# Patient Record
Sex: Male | Born: 1945
Health system: Southern US, Community
[De-identification: ages and names within clinical notes are randomized; demographics above are authoritative.]

## PROBLEM LIST (undated history)

## (undated) DIAGNOSIS — N433 Hydrocele, unspecified: Secondary | ICD-10-CM

## (undated) DIAGNOSIS — R339 Retention of urine, unspecified: Secondary | ICD-10-CM

## (undated) DIAGNOSIS — N503 Cyst of epididymis: Secondary | ICD-10-CM

## (undated) DIAGNOSIS — E559 Vitamin D deficiency, unspecified: Secondary | ICD-10-CM

## (undated) DIAGNOSIS — R310 Gross hematuria: Secondary | ICD-10-CM

## (undated) DIAGNOSIS — E8881 Metabolic syndrome: Secondary | ICD-10-CM

## (undated) DIAGNOSIS — E291 Testicular hypofunction: Secondary | ICD-10-CM

## (undated) DIAGNOSIS — G4700 Insomnia, unspecified: Secondary | ICD-10-CM

## (undated) DIAGNOSIS — G14 Postpolio syndrome: Secondary | ICD-10-CM

## (undated) DIAGNOSIS — G4733 Obstructive sleep apnea (adult) (pediatric): Secondary | ICD-10-CM

## (undated) DIAGNOSIS — F5104 Psychophysiologic insomnia: Secondary | ICD-10-CM

## (undated) DIAGNOSIS — N5082 Scrotal pain: Secondary | ICD-10-CM

## (undated) DIAGNOSIS — M545 Low back pain, unspecified: Secondary | ICD-10-CM

## (undated) DIAGNOSIS — R9431 Abnormal electrocardiogram [ECG] [EKG]: Secondary | ICD-10-CM

## (undated) DIAGNOSIS — E785 Hyperlipidemia, unspecified: Secondary | ICD-10-CM

## (undated) DIAGNOSIS — D649 Anemia, unspecified: Secondary | ICD-10-CM

## (undated) DIAGNOSIS — A809 Acute poliomyelitis, unspecified: Secondary | ICD-10-CM

## (undated) DIAGNOSIS — R739 Hyperglycemia, unspecified: Secondary | ICD-10-CM

## (undated) DIAGNOSIS — K759 Inflammatory liver disease, unspecified: Secondary | ICD-10-CM

## (undated) DIAGNOSIS — I10 Essential (primary) hypertension: Secondary | ICD-10-CM

## (undated) DIAGNOSIS — N400 Enlarged prostate without lower urinary tract symptoms: Secondary | ICD-10-CM

## (undated) DIAGNOSIS — R972 Elevated prostate specific antigen [PSA]: Secondary | ICD-10-CM

## (undated) DIAGNOSIS — N281 Cyst of kidney, acquired: Secondary | ICD-10-CM

## (undated) DIAGNOSIS — N529 Male erectile dysfunction, unspecified: Secondary | ICD-10-CM

## (undated) DIAGNOSIS — E663 Overweight: Secondary | ICD-10-CM

## (undated) HISTORY — DX: Cyst of epididymis: N50.3

## (undated) HISTORY — PX: OTHER SURGICAL HISTORY: SHX169

## (undated) HISTORY — DX: Hyperlipidemia, unspecified: E78.5

## (undated) HISTORY — DX: Overweight: E66.3

## (undated) HISTORY — DX: Low back pain: M54.5

## (undated) HISTORY — DX: Scrotal pain: N50.82

## (undated) HISTORY — DX: Low back pain, unspecified: M54.50

## (undated) HISTORY — DX: Metabolic syndrome: E88.810

## (undated) HISTORY — PX: INGUINAL HERNIA REPAIR: SHX194

## (undated) HISTORY — DX: Obstructive sleep apnea (adult) (pediatric): G47.33

## (undated) HISTORY — DX: Benign prostatic hyperplasia without lower urinary tract symptoms: N40.0

## (undated) HISTORY — DX: Hydrocele, unspecified: N43.3

## (undated) HISTORY — DX: Retention of urine, unspecified: R33.9

## (undated) HISTORY — DX: Male erectile dysfunction, unspecified: N52.9

## (undated) HISTORY — DX: Testicular hypofunction: E29.1

## (undated) HISTORY — PX: COLONOSCOPY: SHX174

## (undated) HISTORY — DX: Essential (primary) hypertension: I10

## (undated) HISTORY — DX: Insomnia, unspecified: G47.00

## (undated) HISTORY — DX: Metabolic syndrome: E88.81

## (undated) HISTORY — DX: Abnormal electrocardiogram (ECG) (EKG): R94.31

## (undated) HISTORY — DX: Psychophysiologic insomnia: F51.04

## (undated) HISTORY — DX: Gross hematuria: R31.0

## (undated) HISTORY — DX: Postpolio syndrome: G14

## (undated) HISTORY — PX: LUMBAR LAMINECTOMY: SHX95

## (undated) HISTORY — DX: Hyperglycemia, unspecified: R73.9

## (undated) HISTORY — DX: Vitamin D deficiency, unspecified: E55.9

## (undated) HISTORY — PX: LEG SURGERY: SHX1003

## (undated) HISTORY — DX: Cyst of kidney, acquired: N28.1

## (undated) HISTORY — DX: Elevated prostate specific antigen (PSA): R97.20

---

## 2004-01-04 ENCOUNTER — Ambulatory Visit: Payer: Self-pay

## 2004-04-06 ENCOUNTER — Ambulatory Visit: Payer: Self-pay

## 2006-01-21 LAB — HM COLONOSCOPY: HM Colonoscopy: NORMAL

## 2006-07-23 ENCOUNTER — Ambulatory Visit: Payer: Self-pay | Admitting: Gastroenterology

## 2007-01-22 HISTORY — PX: TENDON GRAFT: SHX2486

## 2007-03-16 ENCOUNTER — Other Ambulatory Visit: Payer: Self-pay

## 2007-03-16 ENCOUNTER — Ambulatory Visit: Payer: Self-pay | Admitting: Specialist

## 2007-03-17 ENCOUNTER — Ambulatory Visit: Payer: Self-pay | Admitting: Cardiology

## 2007-03-17 ENCOUNTER — Ambulatory Visit: Payer: Self-pay | Admitting: Specialist

## 2007-03-26 ENCOUNTER — Encounter: Payer: Self-pay | Admitting: Specialist

## 2007-04-22 ENCOUNTER — Encounter: Payer: Self-pay | Admitting: Specialist

## 2007-05-22 ENCOUNTER — Encounter: Payer: Self-pay | Admitting: Specialist

## 2007-06-22 ENCOUNTER — Encounter: Payer: Self-pay | Admitting: Specialist

## 2007-07-07 ENCOUNTER — Encounter: Payer: Self-pay | Admitting: Orthopedic Surgery

## 2008-07-04 ENCOUNTER — Encounter: Payer: Self-pay | Admitting: Family Medicine

## 2008-07-21 ENCOUNTER — Encounter: Payer: Self-pay | Admitting: Family Medicine

## 2008-11-10 DIAGNOSIS — E559 Vitamin D deficiency, unspecified: Secondary | ICD-10-CM

## 2009-05-21 HISTORY — PX: ANKLE FUSION: SHX881

## 2012-03-31 ENCOUNTER — Ambulatory Visit: Payer: Self-pay | Admitting: Urology

## 2012-03-31 DIAGNOSIS — N433 Hydrocele, unspecified: Secondary | ICD-10-CM | POA: Diagnosis not present

## 2012-03-31 DIAGNOSIS — L723 Sebaceous cyst: Secondary | ICD-10-CM | POA: Diagnosis not present

## 2012-04-01 DIAGNOSIS — I10 Essential (primary) hypertension: Secondary | ICD-10-CM | POA: Diagnosis not present

## 2012-04-01 DIAGNOSIS — G4733 Obstructive sleep apnea (adult) (pediatric): Secondary | ICD-10-CM | POA: Diagnosis not present

## 2012-06-22 DIAGNOSIS — Z23 Encounter for immunization: Secondary | ICD-10-CM | POA: Diagnosis not present

## 2012-06-22 DIAGNOSIS — Z0000 Encounter for general adult medical examination without abnormal findings: Secondary | ICD-10-CM | POA: Diagnosis not present

## 2012-06-22 DIAGNOSIS — Z1211 Encounter for screening for malignant neoplasm of colon: Secondary | ICD-10-CM | POA: Diagnosis not present

## 2012-06-22 DIAGNOSIS — I10 Essential (primary) hypertension: Secondary | ICD-10-CM | POA: Diagnosis not present

## 2012-07-08 DIAGNOSIS — L821 Other seborrheic keratosis: Secondary | ICD-10-CM | POA: Diagnosis not present

## 2012-07-08 DIAGNOSIS — L820 Inflamed seborrheic keratosis: Secondary | ICD-10-CM | POA: Diagnosis not present

## 2012-08-20 DIAGNOSIS — N400 Enlarged prostate without lower urinary tract symptoms: Secondary | ICD-10-CM | POA: Diagnosis not present

## 2012-08-20 DIAGNOSIS — R972 Elevated prostate specific antigen [PSA]: Secondary | ICD-10-CM | POA: Diagnosis not present

## 2012-08-20 DIAGNOSIS — N433 Hydrocele, unspecified: Secondary | ICD-10-CM | POA: Diagnosis not present

## 2012-09-22 DIAGNOSIS — B91 Sequelae of poliomyelitis: Secondary | ICD-10-CM | POA: Diagnosis not present

## 2012-09-22 DIAGNOSIS — G4733 Obstructive sleep apnea (adult) (pediatric): Secondary | ICD-10-CM | POA: Diagnosis not present

## 2012-09-22 DIAGNOSIS — I10 Essential (primary) hypertension: Secondary | ICD-10-CM | POA: Diagnosis not present

## 2012-09-22 DIAGNOSIS — Z23 Encounter for immunization: Secondary | ICD-10-CM | POA: Diagnosis not present

## 2012-09-22 DIAGNOSIS — Z1331 Encounter for screening for depression: Secondary | ICD-10-CM | POA: Diagnosis not present

## 2012-09-23 DIAGNOSIS — I10 Essential (primary) hypertension: Secondary | ICD-10-CM | POA: Diagnosis not present

## 2013-02-18 DIAGNOSIS — R972 Elevated prostate specific antigen [PSA]: Secondary | ICD-10-CM | POA: Diagnosis not present

## 2013-02-18 DIAGNOSIS — N529 Male erectile dysfunction, unspecified: Secondary | ICD-10-CM | POA: Diagnosis not present

## 2013-02-18 DIAGNOSIS — N433 Hydrocele, unspecified: Secondary | ICD-10-CM | POA: Diagnosis not present

## 2013-03-22 DIAGNOSIS — N4340 Spermatocele of epididymis, unspecified: Secondary | ICD-10-CM | POA: Diagnosis not present

## 2013-03-22 DIAGNOSIS — E8881 Metabolic syndrome: Secondary | ICD-10-CM | POA: Diagnosis not present

## 2013-03-22 DIAGNOSIS — G4733 Obstructive sleep apnea (adult) (pediatric): Secondary | ICD-10-CM | POA: Diagnosis not present

## 2013-03-22 DIAGNOSIS — I10 Essential (primary) hypertension: Secondary | ICD-10-CM | POA: Diagnosis not present

## 2013-04-11 DIAGNOSIS — N433 Hydrocele, unspecified: Secondary | ICD-10-CM | POA: Diagnosis not present

## 2013-04-11 DIAGNOSIS — R319 Hematuria, unspecified: Secondary | ICD-10-CM | POA: Diagnosis not present

## 2013-04-13 DIAGNOSIS — R310 Gross hematuria: Secondary | ICD-10-CM | POA: Diagnosis not present

## 2013-04-28 ENCOUNTER — Ambulatory Visit: Payer: Self-pay | Admitting: Urology

## 2013-04-28 DIAGNOSIS — M6250 Muscle wasting and atrophy, not elsewhere classified, unspecified site: Secondary | ICD-10-CM | POA: Diagnosis not present

## 2013-04-28 DIAGNOSIS — R310 Gross hematuria: Secondary | ICD-10-CM | POA: Diagnosis not present

## 2013-04-28 DIAGNOSIS — N400 Enlarged prostate without lower urinary tract symptoms: Secondary | ICD-10-CM | POA: Diagnosis not present

## 2013-04-28 DIAGNOSIS — N281 Cyst of kidney, acquired: Secondary | ICD-10-CM | POA: Diagnosis not present

## 2013-04-29 DIAGNOSIS — R310 Gross hematuria: Secondary | ICD-10-CM | POA: Diagnosis not present

## 2013-05-06 DIAGNOSIS — R319 Hematuria, unspecified: Secondary | ICD-10-CM | POA: Diagnosis not present

## 2013-08-27 DIAGNOSIS — N400 Enlarged prostate without lower urinary tract symptoms: Secondary | ICD-10-CM | POA: Diagnosis not present

## 2013-08-27 DIAGNOSIS — R972 Elevated prostate specific antigen [PSA]: Secondary | ICD-10-CM | POA: Diagnosis not present

## 2013-09-28 DIAGNOSIS — Z23 Encounter for immunization: Secondary | ICD-10-CM | POA: Diagnosis not present

## 2013-09-28 DIAGNOSIS — Z0000 Encounter for general adult medical examination without abnormal findings: Secondary | ICD-10-CM | POA: Diagnosis not present

## 2013-09-28 DIAGNOSIS — Z9181 History of falling: Secondary | ICD-10-CM | POA: Diagnosis not present

## 2013-09-28 DIAGNOSIS — Z1331 Encounter for screening for depression: Secondary | ICD-10-CM | POA: Diagnosis not present

## 2014-02-25 DIAGNOSIS — N400 Enlarged prostate without lower urinary tract symptoms: Secondary | ICD-10-CM | POA: Diagnosis not present

## 2014-02-25 DIAGNOSIS — N503 Cyst of epididymis: Secondary | ICD-10-CM | POA: Diagnosis not present

## 2014-02-25 DIAGNOSIS — E663 Overweight: Secondary | ICD-10-CM | POA: Diagnosis not present

## 2014-02-25 DIAGNOSIS — R972 Elevated prostate specific antigen [PSA]: Secondary | ICD-10-CM | POA: Diagnosis not present

## 2014-02-25 DIAGNOSIS — R339 Retention of urine, unspecified: Secondary | ICD-10-CM | POA: Diagnosis not present

## 2014-02-25 DIAGNOSIS — F5221 Male erectile disorder: Secondary | ICD-10-CM | POA: Diagnosis not present

## 2014-02-25 LAB — HM PAP SMEAR: HM PAP: NORMAL

## 2014-02-25 LAB — PSA: PSA: NORMAL

## 2014-03-29 DIAGNOSIS — G4733 Obstructive sleep apnea (adult) (pediatric): Secondary | ICD-10-CM | POA: Diagnosis not present

## 2014-03-29 DIAGNOSIS — F5104 Psychophysiologic insomnia: Secondary | ICD-10-CM | POA: Diagnosis not present

## 2014-03-29 DIAGNOSIS — Z13220 Encounter for screening for lipoid disorders: Secondary | ICD-10-CM | POA: Diagnosis not present

## 2014-03-29 DIAGNOSIS — R739 Hyperglycemia, unspecified: Secondary | ICD-10-CM | POA: Diagnosis not present

## 2014-03-29 DIAGNOSIS — I10 Essential (primary) hypertension: Secondary | ICD-10-CM | POA: Diagnosis not present

## 2014-03-29 DIAGNOSIS — E8881 Metabolic syndrome: Secondary | ICD-10-CM | POA: Diagnosis not present

## 2014-03-29 DIAGNOSIS — E559 Vitamin D deficiency, unspecified: Secondary | ICD-10-CM | POA: Diagnosis not present

## 2014-03-29 LAB — LIPID PANEL
Cholesterol: 168 mg/dL (ref 0–200)
HDL: 49 mg/dL (ref 35–70)
LDL Cholesterol: 98 mg/dL
Triglycerides: 106 mg/dL (ref 40–160)

## 2014-03-29 LAB — HEMOGLOBIN A1C: Hgb A1c MFr Bld: 5.8 % (ref 4.0–6.0)

## 2014-04-22 ENCOUNTER — Ambulatory Visit
Admit: 2014-04-22 | Disposition: A | Discharge status: Home / Self Care | Payer: Self-pay | Attending: Urology | Admitting: Urology

## 2014-04-22 DIAGNOSIS — N433 Hydrocele, unspecified: Secondary | ICD-10-CM | POA: Diagnosis not present

## 2014-07-20 ENCOUNTER — Other Ambulatory Visit: Payer: Self-pay | Admitting: Family Medicine

## 2014-07-20 NOTE — Telephone Encounter (Signed)
Patient requesting refill. 

## 2014-07-21 ENCOUNTER — Other Ambulatory Visit: Payer: Self-pay | Admitting: Family Medicine

## 2014-07-21 NOTE — Telephone Encounter (Signed)
Patient requesting refill. 

## 2014-07-21 NOTE — Telephone Encounter (Signed)
Patient needs refill on Atorvistatine to be [facility], Patient has been trying to get this filled for a few days.

## 2014-07-21 NOTE — Telephone Encounter (Signed)
What dose? I can't open [employer] at home

## 2014-07-22 MED ORDER — ATORVASTATIN CALCIUM 40 MG PO TABS: 40.0000 mg | ORAL_TABLET | Freq: Every day | ORAL | Status: DC

## 2014-07-22 NOTE — Telephone Encounter (Signed)
Sorry it is the Atorvastatin 40 mg 1 tablet daily

## 2014-07-28 ENCOUNTER — Encounter: Payer: Self-pay | Admitting: Family Medicine

## 2014-07-28 DIAGNOSIS — G4733 Obstructive sleep apnea (adult) (pediatric): Secondary | ICD-10-CM | POA: Insufficient documentation

## 2014-07-28 DIAGNOSIS — R739 Hyperglycemia, unspecified: Secondary | ICD-10-CM | POA: Insufficient documentation

## 2014-07-28 DIAGNOSIS — N281 Cyst of kidney, acquired: Secondary | ICD-10-CM | POA: Insufficient documentation

## 2014-07-28 DIAGNOSIS — E785 Hyperlipidemia, unspecified: Secondary | ICD-10-CM | POA: Insufficient documentation

## 2014-07-28 DIAGNOSIS — E291 Testicular hypofunction: Secondary | ICD-10-CM | POA: Insufficient documentation

## 2014-07-28 DIAGNOSIS — N529 Male erectile dysfunction, unspecified: Secondary | ICD-10-CM | POA: Insufficient documentation

## 2014-07-28 DIAGNOSIS — G4700 Insomnia, unspecified: Secondary | ICD-10-CM | POA: Insufficient documentation

## 2014-07-28 DIAGNOSIS — N400 Enlarged prostate without lower urinary tract symptoms: Secondary | ICD-10-CM | POA: Insufficient documentation

## 2014-07-28 DIAGNOSIS — E669 Obesity, unspecified: Secondary | ICD-10-CM | POA: Insufficient documentation

## 2014-07-28 DIAGNOSIS — I10 Essential (primary) hypertension: Secondary | ICD-10-CM | POA: Insufficient documentation

## 2014-07-28 DIAGNOSIS — N4340 Spermatocele of epididymis, unspecified: Secondary | ICD-10-CM | POA: Insufficient documentation

## 2014-07-29 ENCOUNTER — Encounter (INDEPENDENT_AMBULATORY_CARE_PROVIDER_SITE_OTHER): Payer: Self-pay

## 2014-07-29 ENCOUNTER — Ambulatory Visit (INDEPENDENT_AMBULATORY_CARE_PROVIDER_SITE_OTHER): Payer: Medicare Other | Admitting: Family Medicine

## 2014-07-29 ENCOUNTER — Encounter: Payer: Self-pay | Admitting: Family Medicine

## 2014-07-29 VITALS — BP 126/68 | HR 84 | Temp 98.2°F | Resp 18 | Ht 69.0 in | Wt 267.9 lb

## 2014-07-29 DIAGNOSIS — H6123 Impacted cerumen, bilateral: Secondary | ICD-10-CM | POA: Diagnosis not present

## 2014-07-29 DIAGNOSIS — Z23 Encounter for immunization: Secondary | ICD-10-CM

## 2014-07-29 DIAGNOSIS — H9191 Unspecified hearing loss, right ear: Secondary | ICD-10-CM

## 2014-07-29 NOTE — Progress Notes (Signed)
Name: Shane Bowen   MRN: [id number]    DOB: Sep 29, 1945   Date:07/29/2014       Progress Note  Subjective  Chief Complaint  Chief Complaint  Patient presents with  . Ear Fullness    slight hearing loss right ear(possible ear wax)    HPI  Cerumen Impaction: patient woke up a week ago with sudden hearing loss right ear, he tried ear lavage at home but it has not improved. No fever, no dizziness, no vertigo, no URI.    Patient Active Problem List   Diagnosis Date Noted  . Kidney cysts 07/28/2014  . BPH (benign prostatic hyperplasia) 07/28/2014  . Insomnia, persistent 07/28/2014  . Dyslipidemia 07/28/2014  . Impotence of organic origin 07/28/2014  . Essential (primary) hypertension 07/28/2014  . Blood glucose elevated 07/28/2014  . Male hypogonadism 07/28/2014  . Obesity (BMI 30-39.9) 07/28/2014  . Obstructive apnea 07/28/2014  . Spermatocele 07/28/2014  . Vitamin D deficiency 11/10/2008    History  Substance Use Topics  . Smoking status: Former Smoker -- 25 years    Types: Cigarettes    Quit date: 01/21/1993  . Smokeless tobacco: Never Used  . Alcohol Use: 0.0 oz/week    0 Standard drinks or equivalent per week     Comment: occasionally     Current outpatient prescriptions:  .  amLODipine-benazepril (LOTREL) 10-20 MG per capsule, Take 1 capsule by mouth daily., Disp: , Rfl:  .  aspirin 81 MG tablet, Take 1 tablet by mouth daily., Disp: , Rfl:  .  atorvastatin (LIPITOR) 40 MG tablet, Take 1 tablet (40 mg total) by mouth daily., Disp: 30 tablet, Rfl: 5 .  bisoprolol-hydrochlorothiazide (ZIAC) 2.5-6.25 MG per tablet, Take 1 tablet by mouth every morning., Disp: , Rfl:  .  tadalafil (CIALIS) 20 MG tablet, Take 1 tablet by mouth as needed., Disp: , Rfl:   Allergies  Allergen Reactions  . Duloxetine Hcl     ROS  Ten systems reviewed and is negative except as mentioned in HPI Losing weight, but life style modification  Objective  Filed Vitals:   07/29/14  0820  BP: 126/68  Pulse: 84  Temp: 98.2 F (36.8 C)  TempSrc: Oral  Resp: 18  Height: 5\' 9"  (1.753 m)  Weight: 267 lb 14.4 oz (121.519 kg)  SpO2: 94%    Body mass index is 39.54 kg/(m^2).    Physical Exam  Constitutional: Patient appears well-developed and well-nourished. No distress. Obesity Eyes:  No scleral icterus.  Neck: Normal range of motion. Neck supple. Cardiovascular: Normal rate, regular rhythm and normal heart sounds.  No murmur heard. No BLE edema. Pulmonary/Chest: Effort normal and breath sounds normal. No respiratory distress. Psychiatric: Patient has a normal mood and affect. behavior is normal. Judgment and thought content normal. Ear exam: bilateral cerumen.  Right side completely occluded.     Assessment & Plan  1. Hearing loss, right  - Ear Lavage   2. Cerumen impaction, bilateral  - Ear Lavage: patient gave verbal consent. An elephant tube was used, warm water and peroxide. Patient tolerated procedure well.   3. Need for pneumococcal vaccination

## 2014-08-02 ENCOUNTER — Encounter: Payer: Self-pay | Admitting: Family Medicine

## 2014-08-02 LAB — HM PAP SMEAR

## 2014-08-25 ENCOUNTER — Ambulatory Visit: Payer: Self-pay | Admitting: Urology

## 2014-09-30 ENCOUNTER — Ambulatory Visit (INDEPENDENT_AMBULATORY_CARE_PROVIDER_SITE_OTHER): Payer: Medicare Other | Admitting: Family Medicine

## 2014-09-30 ENCOUNTER — Encounter: Payer: Self-pay | Admitting: Family Medicine

## 2014-09-30 VITALS — BP 116/64 | HR 87 | Temp 97.9°F | Resp 18 | Ht 69.0 in | Wt 273.2 lb

## 2014-09-30 DIAGNOSIS — R2681 Unsteadiness on feet: Secondary | ICD-10-CM

## 2014-09-30 DIAGNOSIS — Z0000 Encounter for general adult medical examination without abnormal findings: Secondary | ICD-10-CM

## 2014-09-30 DIAGNOSIS — R739 Hyperglycemia, unspecified: Secondary | ICD-10-CM | POA: Diagnosis not present

## 2014-09-30 DIAGNOSIS — K429 Umbilical hernia without obstruction or gangrene: Secondary | ICD-10-CM

## 2014-09-30 DIAGNOSIS — Z8612 Personal history of poliomyelitis: Secondary | ICD-10-CM | POA: Diagnosis not present

## 2014-09-30 DIAGNOSIS — Z23 Encounter for immunization: Secondary | ICD-10-CM

## 2014-09-30 DIAGNOSIS — G14 Postpolio syndrome: Secondary | ICD-10-CM | POA: Diagnosis not present

## 2014-09-30 DIAGNOSIS — I10 Essential (primary) hypertension: Secondary | ICD-10-CM

## 2014-09-30 DIAGNOSIS — E785 Hyperlipidemia, unspecified: Secondary | ICD-10-CM | POA: Diagnosis not present

## 2014-09-30 DIAGNOSIS — E559 Vitamin D deficiency, unspecified: Secondary | ICD-10-CM | POA: Diagnosis not present

## 2014-09-30 DIAGNOSIS — G4733 Obstructive sleep apnea (adult) (pediatric): Secondary | ICD-10-CM

## 2014-09-30 MED ORDER — AMLODIPINE BESY-BENAZEPRIL HCL 10-20 MG PO CAPS: 1.0000 | ORAL_CAPSULE | Freq: Every day | ORAL | Status: DC

## 2014-09-30 MED ORDER — ATORVASTATIN CALCIUM 40 MG PO TABS: 40.0000 mg | ORAL_TABLET | Freq: Every day | ORAL | Status: DC

## 2014-09-30 NOTE — Progress Notes (Signed)
Name: Shane Bowen   MRN: [id number]    DOB: [birth year]   Date:09/30/2014       Progress Note  Subjective  Chief Complaint  Chief Complaint  Patient presents with  . Annual Exam    HPI  Functional ability/safety issues: He has a history of Poliomyelitis , he usees a scooter to use prn when walks long distance  Hearing issues: Addressed   Activities of daily living: Discussed  Home safety issues: No Issues  End Of Life Planning: he has advanced directives, healthcare power of attorney.  Preventative care, Health maintenance, Preventative health measures discussed.  Preventative screenings discussed today: lab work, colonoscopy, PSA ( sees Insurance underwriter )   Men age 24 to 24 years if ever smoked recommended to get a one time AAA ultrasound screening exam. He wants to hold off on it at this time  Low Dose CT Chest recommended if Age 77-80 years, 30 pack-year currently smoking OR have quit w/in 15years.   Lifestyle risk factor issued reviewed: Diet, exercise, weight management, advised patient smoking is not healthy, nutrition/diet.  Preventative health measures discussed (5-10 year plan).  Reviewed and recommended vaccinations: - Pneumovax  - Prevnar  - Annual Influenza - Zostavax - Tdap   Depression screening: Done Fall risk screening: Done Discuss ADLs/IADLs: Done  Current medical providers: See HPI  Other health risk factors identified this visit: No other issues Cognitive impairment issues: None identified  All above discussed with patient. Appropriate education, counseling and referral will be made based upon the above.   OSA: he wears his CPAP every night, but ESS is still high at 15.  He is due for repeat sleep study  HTN: bp is towards low end of normal, we will stop Ziac today and recheck next visit, he was advised to monitor at home and call back if bp goes above 140/90  Dyslipidemia: lipid panel looked normal, but his ASCVD was 14% and he has been on  statin therapy, denies side effects  Patient Active Problem List   Diagnosis Date Noted  . Kidney cysts 07/28/2014  . BPH (benign prostatic hyperplasia) 07/28/2014  . Insomnia, persistent 07/28/2014  . Dyslipidemia 07/28/2014  . Impotence of organic origin 07/28/2014  . Essential (primary) hypertension 07/28/2014  . Blood glucose elevated 07/28/2014  . Male hypogonadism 07/28/2014  . Obesity (BMI 30-39.9) 07/28/2014  . Obstructive apnea 07/28/2014  . Spermatocele 07/28/2014  . Vitamin D deficiency 11/10/2008    Past Surgical History  Procedure Laterality Date  . Ankle fusion Right 05/2009  . Tendon graft Right 2009    Hand after Lacertion Injury   . Inguinal hernia repair    . Lumbar laminectomy      Family History  Problem Relation Age of Onset  . Hyperlipidemia Mother   . Heart disease Father   . Heart disease Sister     ATF    Social History   Social History  . Marital Status: Married    Spouse Name: N/A  . Number of Children: N/A  . Years of Education: N/A   Occupational History  . Not on file.   Social History Main Topics  . Smoking status: Former Smoker -- 25 years    Types: Cigarettes    Quit date: 01/21/1993  . Smokeless tobacco: Never Used  . Alcohol Use: 0.0 oz/week    0 Standard drinks or equivalent per week     Comment: occasionally  . Drug Use: No  . Sexual Activity: Not Currently  Other Topics Concern  . Not on file   Social History Narrative     Current outpatient prescriptions:  .  amLODipine-benazepril (LOTREL) 10-20 MG per capsule, Take 1 capsule by mouth daily., Disp: 30 capsule, Rfl: 5 .  aspirin 81 MG tablet, Take 1 tablet by mouth daily., Disp: , Rfl:  .  atorvastatin (LIPITOR) 40 MG tablet, Take 1 tablet (40 mg total) by mouth daily., Disp: 30 tablet, Rfl: 5 .  tadalafil (CIALIS) 20 MG tablet, Take 1 tablet by mouth as needed., Disp: , Rfl:   Allergies  Allergen Reactions  . Duloxetine Hcl      ROS  Constitutional:  Negative for fever and positive for weight change.  Respiratory: Negative for cough , positive for  shortness of breath with activity.   Cardiovascular: Negative for chest pain or palpitations.  Gastrointestinal: Negative for abdominal pain, no bowel changes.  Musculoskeletal: Positive  for gait problem- right leg is shorter and has severe atrophy from polio,  No  joint swelling.  Skin: Negative for rash.  Neurological: Negative for dizziness or headache.  No other specific complaints in a complete review of systems (except as listed in HPI above).  Objective  Filed Vitals:   09/30/14 0835  BP: 116/64  Pulse: 87  Temp: 97.9 F (36.6 C)  TempSrc: Oral  Resp: 18  Height: 5\' 9"  (1.753 m)  Weight: 273 lb 3.2 oz (123.923 kg)  SpO2: 97%    Body mass index is 40.33 kg/(m^2).  Physical Exam  Constitutional: Patient appears well-developed and obesity . No distress.  HENT: Head: Normocephalic and atraumatic. Ears: B TMs ok, no erythema or effusion; Nose: Nose normal. Mouth/Throat: Oropharynx is clear and moist. No oropharyngeal exudate.  Eyes: Conjunctivae and EOM are normal. Pupils are equal, round, and reactive to light. No scleral icterus.  Neck: Normal range of motion. Neck supple. No JVD present. No thyromegaly present.  Cardiovascular: Normal rate, regular rhythm and normal heart sounds.  No murmur heard. No BLE edema. Pulmonary/Chest: Effort normal and breath sounds normal. No respiratory distress. Abdominal: Soft. Bowel sounds are normal, no distension. There is no tenderness. no masses MALE GENITALIA: sees Urologist RECTAL: seeing Urologist Musculoskeletal: atrophy of right leg, and scaring from previous surgeries Neurological: he is alert and oriented to person, place, and time. No cranial nerve deficit. Weak right leg from polio, atrophy , normal sensation  Skin: Skin is warm and dry. No rash noted. No erythema.  Psychiatric: Patient has a normal mood and affect. behavior is  normal. Judgment and thought content normal.  Diabetic Foot Exam:   PHQ2/9: Depression screen [facility] 2/9 09/30/2014 07/29/2014  Decreased Interest 0 0  Down, Depressed, Hopeless 0 0  PHQ - 2 Score 0 0     Fall Risk: Fall Risk  09/30/2014 07/29/2014  Falls in the past year? Yes Yes  Number falls in past yr: 1 1  Injury with Fall? No No      Functional Status Survey: Is the patient deaf or have difficulty hearing?: No Does the patient have difficulty seeing, even when wearing glasses/contacts?: Yes (glasses) Does the patient have difficulty concentrating, remembering, or making decisions?: No Does the patient have difficulty walking or climbing stairs?: Yes (walks with a cane) Does the patient have difficulty dressing or bathing?: No Does the patient have difficulty doing errands alone such as visiting a doctor's office or shopping?: No   Assessment & Plan  1. Medicare annual wellness visit, subsequent Discussed importance of 150  minutes of physical activity weekly, eat two servings of fish weekly, eat one serving of tree nuts ( cashews, pistachios, pecans, almonds.Marland Kitchen) every other day, eat 6 servings of fruit/vegetables daily and drink plenty of water and avoid sweet beverages.  To improve HDL patient  needs to eat tree nuts ( pecans/pistachios/almonds ) four times weekly, eat fish two times weekly  and exercise  at least 150 minutes per week  2. Needs flu shot  - Flu vaccine HIGH DOSE PF (Fluzone High dose)  3. Dyslipidemia  - atorvastatin (LIPITOR) 40 MG tablet; Take 1 tablet (40 mg total) by mouth daily.  Dispense: 30 tablet; Refill: 5 - Lipid panel  4. Vitamin D deficiency  - Vit D  25 hydroxy (rtn osteoporosis monitoring)  5. Obstructive apnea  - Nocturnal polysomnography (NPSG); Future  6. Essential (primary) hypertension Stop Ziac - amLODipine-benazepril (LOTREL) 10-20 MG per capsule; Take 1 capsule by mouth daily.  Dispense: 30 capsule; Refill: 5 - Comprehensive  metabolic panel  7. Blood glucose elevated  - Hemoglobin A1c

## 2014-09-30 NOTE — Patient Instructions (Signed)
  Mr. Shane Bowen , Thank you for taking time to come for your Medicare Wellness Visit. I appreciate your ongoing commitment to your health goals. Please review the following plan we discussed and let me know if I can assist you in the future.   These are the goals we discussed:  -eat 6 servings of fruit and vegetables daily -eat tree nuts - one serving - every other day -loose 5 lbs before next visit   This is a list of the screening recommended for you and due dates:  Health Maintenance  Topic Date Due  . Flu Shot  08/22/2015  . Colon Cancer Screening  01/22/2016  . Tetanus Vaccine  05/11/2017  . Shingles Vaccine  Completed  .  Hepatitis C: One time screening is recommended by Center for Disease Control  (CDC) for  adults born from [birth year] through 1963.   Completed  . Pneumonia vaccines  Completed

## 2014-10-01 LAB — LIPID PANEL
CHOL/HDL RATIO: 2.5 ratio (ref 0.0–5.0)
Cholesterol, Total: 130 mg/dL (ref 100–199)
HDL: 53 mg/dL (ref >39)
LDL Calculated: 54 mg/dL (ref 0–99)
Triglycerides: 116 mg/dL (ref 0–149)
VLDL Cholesterol Cal: 23 mg/dL (ref 5–40)

## 2014-10-01 LAB — COMPREHENSIVE METABOLIC PANEL
ALBUMIN: 4.4 g/dL (ref 3.6–4.8)
ALK PHOS: 97 IU/L (ref 39–117)
ALT: 39 IU/L (ref 0–44)
AST: 24 IU/L (ref 0–40)
Albumin/Globulin Ratio: 1.8 (ref 1.1–2.5)
BUN / CREAT RATIO: 16 (ref 10–22)
BUN: 15 mg/dL (ref 8–27)
Bilirubin Total: 0.5 mg/dL (ref 0.0–1.2)
CALCIUM: 10 mg/dL (ref 8.6–10.2)
CO2: 26 mmol/L (ref 18–29)
CREATININE: 0.94 mg/dL (ref 0.76–1.27)
Chloride: 96 mmol/L — ABNORMAL LOW (ref 97–108)
GFR calc non Af Amer: 84 mL/min/{1.73_m2} (ref >59)
GFR, EST AFRICAN AMERICAN: 97 mL/min/{1.73_m2} (ref >59)
GLOBULIN, TOTAL: 2.4 g/dL (ref 1.5–4.5)
Glucose: 100 mg/dL — ABNORMAL HIGH (ref 65–99)
Potassium: 4.6 mmol/L (ref 3.5–5.2)
SODIUM: 139 mmol/L (ref 134–144)
Total Protein: 6.8 g/dL (ref 6.0–8.5)

## 2014-10-01 LAB — HEMOGLOBIN A1C
Est. average glucose Bld gHb Est-mCnc: 123 mg/dL
HEMOGLOBIN A1C: 5.9 % — AB (ref 4.8–5.6)

## 2014-10-01 LAB — VITAMIN D 25 HYDROXY (VIT D DEFICIENCY, FRACTURES): VIT D 25 HYDROXY: 22.2 ng/mL — AB (ref 30.0–100.0)

## 2014-10-03 NOTE — Progress Notes (Signed)
Patient notified

## 2014-10-10 ENCOUNTER — Encounter: Payer: Self-pay | Admitting: *Deleted

## 2014-10-10 ENCOUNTER — Ambulatory Visit (INDEPENDENT_AMBULATORY_CARE_PROVIDER_SITE_OTHER): Payer: Medicare Other | Admitting: Family Medicine

## 2014-10-10 VITALS — BP 138/72 | HR 96 | Temp 98.2°F | Resp 18 | Wt 267.5 lb

## 2014-10-10 DIAGNOSIS — J40 Bronchitis, not specified as acute or chronic: Secondary | ICD-10-CM

## 2014-10-10 MED ORDER — AMOXICILLIN-POT CLAVULANATE 875-125 MG PO TABS: 1.0000 | ORAL_TABLET | Freq: Two times a day (BID) | ORAL | Status: DC

## 2014-10-10 MED ORDER — BENZONATATE 200 MG PO CAPS: 200.0000 mg | ORAL_CAPSULE | Freq: Three times a day (TID) | ORAL | Status: DC | PRN

## 2014-10-10 NOTE — Patient Instructions (Signed)

## 2014-10-10 NOTE — Progress Notes (Signed)
Name: Shane Bowen   MRN: [id number]    DOB: 1945/12/16   Date:10/10/2014       Progress Note  Subjective  Chief Complaint  Chief Complaint  Patient presents with  . URI    HPI  Patient is here today with concerns regarding the following symptoms sore throat, congestion, sneezing, productive cough, tightness in his chest and some clear nasal discharge that started about a week ago. Has tried the following home remedies: Day-Quil and Ny-Quil. Not associated with fever, rash, sick contacts, recent travel. Did have some loose BMs starting about day 4 of his illness. He rarely gets ill he states.    Past Medical History  Diagnosis Date  . Vitamin D deficiency   . Bilateral renal cysts   . BPH with elevated PSA   . Incomplete bladder emptying   . Hyperlipidemia   . Hypertension   . Obstructive sleep apnea   . Chronic insomnia   . Metabolic syndrome   . Lumbago   . Post-polio syndrome   . Insomnia   . Hyperglycemia   . Androgen deficiency   . Scrotal pain   . Erectile dysfunction   . Epididymal cyst   . Over weight   . Gross hematuria   . Hydrocele, right   . Abnormal ECG     Social History  Substance Use Topics  . Smoking status: Former Smoker -- 25 years    Types: Cigarettes    Quit date: 01/21/1993  . Smokeless tobacco: Never Used  . Alcohol Use: 0.0 oz/week    0 Standard drinks or equivalent per week     Comment: occasionally     Current outpatient prescriptions:  .  amLODipine-benazepril (LOTREL) 10-20 MG per capsule, Take 1 capsule by mouth daily., Disp: 30 capsule, Rfl: 5 .  aspirin 81 MG tablet, Take 1 tablet by mouth daily., Disp: , Rfl:  .  atorvastatin (LIPITOR) 40 MG tablet, Take 1 tablet (40 mg total) by mouth daily., Disp: 30 tablet, Rfl: 5 .  tadalafil (CIALIS) 20 MG tablet, Take 1 tablet by mouth as needed., Disp: , Rfl:   Allergies  Allergen Reactions  . Duloxetine Hcl     ROS  Positive for sore throat, congestion, sneezing,  productive cough, tightness in his chest and some clear nasal discharge, loose BMs as mentioned in HPI, otherwise all systems reviewed and are negative.  Objective  Filed Vitals:   10/10/14 1553  BP: 138/72  Pulse: 96  Temp: 98.2 F (36.8 C)  TempSrc: Oral  Resp: 18  Weight: 267 lb 8 oz (121.337 kg)  SpO2: 95%   Body mass index is 39.48 kg/(m^2).   Physical Exam  Constitutional: Patient is obese and well-nourished. In no acute distress but does appear to be fatigued from acute illness. HEENT:  - Head: Normocephalic and atraumatic.  - Ears: RIGHT TM bulging with minimal clear exudate, LEFT TM bulging with minimal clear exudate.  - Nose: Nasal mucosa boggy and congested.  - Mouth/Throat: Oropharynx is moist with slight erythema of bilateral tonsils without hypertrophy or exudates. Post nasal drainage present.  - Eyes: Conjunctivae clear, EOM movements normal. PERRLA. No scleral icterus.  Neck: Normal range of motion. Neck supple. No JVD present. No thyromegaly present. No local lymphadenopathy. Cardiovascular: Regular rate, regular rhythm with no murmurs heard.  Pulmonary/Chest: Effort normal and breath sounds clear in all lung fields with exception to upper anterior airway mild rhonchi. Musculoskeletal: Normal range of motion bilateral UE and LE,  no joint effusions. Skin: Skin is warm and dry. No rash noted. Psychiatric: Patient has a normal mood and affect. Behavior is normal in office today. Judgment and thought content normal in office today.   Assessment & Plan 1. Bronchitis Likely initial viral prodrome now superimposed with bacterial infection. Instructed patient on increasing hydration, nasal saline spray, steam inhalation, NSAID if tolerated and not contraindicated. Start augmentin daily and tessalon perls as needed. If symptoms persist/worsen may call me for CXR.   - amoxicillin-clavulanate (AUGMENTIN) 875-125 MG per tablet; Take 1 tablet by mouth 2 (two) times daily.   Dispense: 20 tablet; Refill: 0 - benzonatate (TESSALON) 200 MG capsule; Take 1 capsule (200 mg total) by mouth 3 (three) times daily as needed for cough.  Dispense: 20 capsule; Refill: 0

## 2014-10-17 ENCOUNTER — Ambulatory Visit (INDEPENDENT_AMBULATORY_CARE_PROVIDER_SITE_OTHER): Payer: Medicare Other | Admitting: Urology

## 2014-10-17 ENCOUNTER — Encounter: Payer: Self-pay | Admitting: Urology

## 2014-10-17 VITALS — BP 107/71 | HR 74 | Ht 69.0 in | Wt 265.7 lb

## 2014-10-17 DIAGNOSIS — N529 Male erectile dysfunction, unspecified: Secondary | ICD-10-CM | POA: Insufficient documentation

## 2014-10-17 DIAGNOSIS — N401 Enlarged prostate with lower urinary tract symptoms: Secondary | ICD-10-CM

## 2014-10-17 DIAGNOSIS — R972 Elevated prostate specific antigen [PSA]: Secondary | ICD-10-CM

## 2014-10-17 DIAGNOSIS — N400 Enlarged prostate without lower urinary tract symptoms: Secondary | ICD-10-CM | POA: Diagnosis not present

## 2014-10-17 DIAGNOSIS — Z87448 Personal history of other diseases of urinary system: Secondary | ICD-10-CM | POA: Diagnosis not present

## 2014-10-17 DIAGNOSIS — N528 Other male erectile dysfunction: Secondary | ICD-10-CM | POA: Diagnosis not present

## 2014-10-17 DIAGNOSIS — N138 Other obstructive and reflux uropathy: Secondary | ICD-10-CM

## 2014-10-17 LAB — MICROSCOPIC EXAMINATION: RBC, UA: NONE SEEN /hpf (ref 0–?)

## 2014-10-17 LAB — URINALYSIS, COMPLETE
Bilirubin, UA: NEGATIVE
Glucose, UA: NEGATIVE
KETONES UA: NEGATIVE
LEUKOCYTES UA: NEGATIVE
Nitrite, UA: NEGATIVE
PH UA: 5 (ref 5.0–7.5)
PROTEIN UA: NEGATIVE
SPEC GRAV UA: 1.02 (ref 1.005–1.030)
Urobilinogen, Ur: 0.2 mg/dL (ref 0.2–1.0)

## 2014-10-17 NOTE — Progress Notes (Signed)
10/17/2014 4:20 PM   Shane Bowen [birth year] [id number]  Referring provider: Alba Cory, MD [address] [place], [place] [address]  Chief Complaint  Patient presents with  . Benign Prostatic Hypertrophy    30month follow up    HPI: Patient is a 69 year old white male who has a history of erectile dysfunction, elevated PSA and BPH with LUTS who presents today for a 6 month follow up.    Erectile dysfunction His SHIM score is incomplete, which is not score able.   He has been having difficulty with erections for over five years.   His major complaint is not having willing partner.  His libido is preserved.   His risk factors for ED are age, BPH, HTN and hypercholesterolemia.  He denies any painful erections or curvatures with his erections.   He has tried Cialis in the past.  He states he has not attempted sexual intercourse in over 5 years.        SHIM      10/17/14 0950       SHIM: Over the last 6 months:   How do you rate your confidence that you could get and keep an erection? Very Low     When you had erections with sexual stimulation, how often were your erections hard enough for penetration (entering your partner)? No Sexual Activity  Pt states he have not had intercourse in 5 yrs      During sexual intercourse, how often were you able to maintain your erection after you had penetrated (entered) your partner? Did not attempt intercourse     During sexual intercourse, how difficult was it to maintain your erection to completion of intercourse? Did not attempt intercourse     When you attempted sexual intercourse, how often was it satisfactory for you? Did not attempt intercourse     SHIM Total Score   SHIM 1        Score: 1-7 Severe ED 8-11 Moderate ED 12-16 Mild-Moderate ED 17-21 Mild ED 22-25 No ED  Elevated PSA Patient had an elevated PSA of 4.5 ng/mL on 10/03/2011.  His PSA's have returned below 4 since that time.  His latest PSA was  3.8 ng/mL on 02/25/2014.  BPH WITH LUTS His IPSS score today is 14, which is moderate lower urinary tract symptomatology. He is mixed with his quality life due to his urinary symptoms.  His major complaint is urinary hesitancy and incomplete emptying.  He is not wanting to start medication at this time. His previous PVR is > 39 mL.  He denies any dysuria, hematuria or suprapubic pain.   He also denies any recent fevers, chills, nausea or vomiting.   He does not have a family history of PCa.      IPSS      10/17/14 0900       International Prostate Symptom Score   How often have you had the sensation of not emptying your bladder? Less than half the time     How often have you had to urinate less than every two hours? About half the time     How often have you found you stopped and started again several times when you urinated? More than half the time     How often have you found it difficult to postpone urination? Less than 1 in 5 times     How often have you had a weak urinary stream? Less than 1 in 5 times  How often have you had to strain to start urination? Less than half the time     How many times did you typically get up at night to urinate? 1 Time     Total IPSS Score 14     Quality of Life due to urinary symptoms   If you were to spend the rest of your life with your urinary condition just the way it is now how would you feel about that? Mixed        Score:  1-7 Mild 8-19 Moderate 20-35 Severe  History of gross hematuria Patient underwent workup for gross hematuria in 04/2013 with CT urogram and cystoscopically. He was found to have bilateral renal cysts, an enlarged prostate and hydrocele.  His UA today did not demonstrate any microscopic hematuria and he did not endorse any recent gross hematuria.   PMH: Past Medical History  Diagnosis Date  . Vitamin D deficiency   . Bilateral renal cysts   . BPH with elevated PSA   . Incomplete bladder emptying   .  Hyperlipidemia   . Hypertension   . Obstructive sleep apnea   . Chronic insomnia   . Metabolic syndrome   . Lumbago   . Post-polio syndrome   . Insomnia   . Hyperglycemia   . Androgen deficiency   . Scrotal pain   . Erectile dysfunction   . Epididymal cyst   . Over weight   . Gross hematuria   . Hydrocele, right   . Abnormal ECG     Surgical History: Past Surgical History  Procedure Laterality Date  . Ankle fusion Right 05/2009  . Tendon graft Right 2009    Hand after Lacertion Injury   . Inguinal hernia repair    . Lumbar laminectomy      Home Medications:    Medication List       This list is accurate as of: 10/17/14  4:20 PM.  Always use your most recent med list.               amLODipine-benazepril 10-20 MG per capsule  Commonly known as:  LOTREL  Take 1 capsule by mouth daily.     amoxicillin-clavulanate 875-125 MG per tablet  Commonly known as:  AUGMENTIN  Take 1 tablet by mouth 2 (two) times daily.     aspirin 81 MG tablet  Take 1 tablet by mouth daily.     atorvastatin 40 MG tablet  Commonly known as:  LIPITOR  Take 1 tablet (40 mg total) by mouth daily.     benzonatate 200 MG capsule  Commonly known as:  TESSALON  Take 1 capsule (200 mg total) by mouth 3 (three) times daily as needed for cough.     CIALIS 20 MG tablet  Generic drug:  tadalafil  Take 1 tablet by mouth as needed.        Allergies:  Allergies  Allergen Reactions  . Duloxetine Hcl     Family History: Family History  Problem Relation Age of Onset  . Hyperlipidemia Mother   . Heart disease Father   . Heart disease Sister     ATF  . Prostate cancer Neg Hx     Social History:  reports that he quit smoking about 21 years ago. His smoking use included Cigarettes. He quit after 25 years of use. He has never used smokeless tobacco. He reports that he drinks alcohol. He reports that he does not use illicit drugs.  ROS: UROLOGY Frequent  Urination?: Yes Hard to postpone  urination?: No Burning/pain with urination?: No Get up at night to urinate?: No Leakage of urine?: No Urine stream starts and stops?: Yes Trouble starting stream?: No Do you have to strain to urinate?: No Blood in urine?: No Urinary tract infection?: No Sexually transmitted disease?: No Injury to kidneys or bladder?: No Painful intercourse?: No Weak stream?: No Erection problems?: No Penile pain?: No  Gastrointestinal Nausea?: No Vomiting?: No Indigestion/heartburn?: No Diarrhea?: No Constipation?: No  Constitutional Fever: No Night sweats?: No Weight loss?: No Fatigue?: No  Skin Skin rash/lesions?: No Itching?: No  Eyes Blurred vision?: No Double vision?: No  Ears/Nose/Throat Sore throat?: No Sinus problems?: No  Hematologic/Lymphatic Swollen glands?: No Easy bruising?: No  Cardiovascular Leg swelling?: No Chest pain?: No  Respiratory Cough?: No Shortness of breath?: No  Endocrine Excessive thirst?: No  Musculoskeletal Back pain?: No Joint pain?: No  Neurological Headaches?: No Dizziness?: No  Psychologic Depression?: No Anxiety?: No  Physical Exam: BP 107/71 mmHg  Pulse 74  Ht  (1.753 m)  Wt 265 lb 11.2 oz (120.521 kg)  BMI 39.22 kg/m2  GU: Patient with circumcised phallus.  Urethral meatus is patent.  No penile discharge. No penile lesions or rashes. Scrotum without lesions, cysts, rashes and/or edema.  Bilateral hydroceles.  Testes and epidiymis cannot be palpated.  Testes and epididymis were normal on 04/2014 scrotal ultrasound.   Rectal: Patient with  normal sphincter tone. Perineum without scarring or rashes. No rectal masses are appreciated. Prostate is approximately 50 grams (could not palpated entire gland due to buttocks tissue), no nodules are appreciated. Seminal vesicles are normal.   Laboratory Data:  Lab Results  Component Value Date   CREATININE 0.94 09/30/2014   PSA history:  3.2 ng/mL on 02/18/2013  3.7  ng/mL on 08/27/2013  3.8 ng/mL on 02/25/2014   Lab Results  Component Value Date   HGBA1C 5.9* 09/30/2014   Urinalysis: Results for orders placed or performed in visit on 10/17/14  Microscopic Examination  Result Value Ref Range   WBC, UA 0-5 0 -  5 /hpf   RBC, UA None seen 0 -  2 /hpf   Epithelial Cells (non renal) 0-10 0 - 10 /hpf   Mucus, UA Present (A) Not Estab.   Bacteria, UA Moderate (A) None seen/Few  Urinalysis, Complete  Result Value Ref Range   Specific Gravity, UA 1.020 1.005 - 1.030   pH, UA 5.0 5.0 - 7.5   Color, UA Yellow Yellow   Appearance Ur Clear Clear   Leukocytes, UA Negative Negative   Protein, UA Negative Negative/Trace   Glucose, UA Negative Negative   Ketones, UA Negative Negative   RBC, UA Trace (A) Negative   Bilirubin, UA Negative Negative   Urobilinogen, Ur 0.2 0.2 - 1.0 mg/dL   Nitrite, UA Negative Negative   Microscopic Examination See below:      Assessment & Plan:    1. Erectile dysfunction:   Patient states he has not been sexually active in over 5 years. He would still like to have a prescription for Cialis 20 mg on-demand in case the opportunity presents itself.   We will readdress when he returns in 6 months.  2. Elevated PSA:    Patient had an elevated PSA of 4.5 ng/mL on 10/03/2011.  His PSA's have returned below 4 since that time.  His latest PSA was 3.8 ng/mL on 02/25/2014.  He would like to continue biannual monitoring. He will return  in 6 months for PSA and DRE.  3. BPH (benign prostatic hyperplasia) with LUTS:    Patient's IPSS score is 14/3.  He would like to continue observation over time with prn avoidance of alcohol/caffeine.    Patient like to pursue medical treatment.   He will follow up in 6 months for a PSA, DRE and an IPSS.    - PSA  4. History of gross hematuria:  Patient underwent workup for gross hematuria in 04/2013 with CT urogram and cystoscopy. He was found to have bilateral renal cysts, an enlarged prostate and  hydrocele.  He UA did not demonstrate any microscopic hematuria and he did not endorse any gross hematuria.  He will be RTC in 6 months and we will recheck an UA.    Return in about 6 months (around 04/16/2015) for IPSS, PVR and UA.  Michiel Cowboy, PA-C  [facility] [address] [place], [place] [address] [phone]

## 2014-10-18 ENCOUNTER — Telehealth: Payer: Self-pay

## 2014-10-18 LAB — PSA: Prostate Specific Ag, Serum: 4.3 ng/mL — ABNORMAL HIGH (ref 0.0–4.0)

## 2014-10-18 NOTE — Telephone Encounter (Signed)
-----   Message from Harle Battiest, PA-C sent at 10/18/2014 11:23 AM EDT ----- Please add a free and total PSA to the patient's blood work.

## 2014-10-18 NOTE — Telephone Encounter (Signed)
Spoke with [facility] and test were added. 

## 2014-10-19 LAB — PSA, TOTAL AND FREE
PSA, Free Pct: 19.8 %
PSA, Free: 0.85 ng/mL
Prostate Specific Ag, Serum: 4.3 ng/mL — ABNORMAL HIGH (ref 0.0–4.0)

## 2014-10-20 LAB — SPECIMEN STATUS REPORT

## 2014-10-21 ENCOUNTER — Telehealth: Payer: Self-pay

## 2014-10-21 NOTE — Telephone Encounter (Signed)
Spoke with pt in reference to PSA. Discussed in depth the options of prostate bx and monitoring PSA every 36mo. Pt stated he would like to think about this and call back with a decision. Nurse reassured pt that would be ok.

## 2014-10-21 NOTE — Telephone Encounter (Signed)
-----   Message from Harle Battiest, PA-C sent at 10/20/2014  1:02 PM EDT ----- Patient's PSA is 4.3 with a 23% probability for prostate cancer.  He can undergo a biopsy at this time or continue to monitor the PSA every six months with the understanding he may have cancer of his prostate.

## 2014-11-09 NOTE — Telephone Encounter (Signed)
Did patient decide on whether he wanted to undergo a prostate biopsy or return in 6 months?

## 2014-11-10 ENCOUNTER — Ambulatory Visit (INDEPENDENT_AMBULATORY_CARE_PROVIDER_SITE_OTHER): Payer: Medicare Other | Admitting: Urology

## 2014-11-10 ENCOUNTER — Encounter: Payer: Self-pay | Admitting: Urology

## 2014-11-10 VITALS — BP 139/76 | HR 91 | Ht 69.0 in | Wt 267.9 lb

## 2014-11-10 DIAGNOSIS — R972 Elevated prostate specific antigen [PSA]: Secondary | ICD-10-CM

## 2014-11-10 NOTE — Progress Notes (Signed)
11/10/2014 11:22 AM   Shane Bowen 10-29-1945 [id number]  Referring provider: Alba Cory, MD [address] [place], [place] [address]  Chief Complaint  Patient presents with  . Advice Only    patient has questions about labs and biopsy    HPI: Patient is a 69 year old white male who presents today for further discussion regarding his rise in his PSA.   Patient's PSA went from 3.8 ng/mL in February 2016 to 4.3 recently.  This is greater than a 0.75 increase in less than a year's time.  When a free and total PSA was added to the blood work, it returned with a 23% probability for prostate cancer.  His PSA has been as high as 4.3 in 2013. He is not taking finasteride or dutasteride at this time. He does not have a family history of prostate cancer.  PMH: Past Medical History  Diagnosis Date  . Vitamin D deficiency   . Bilateral renal cysts   . BPH with elevated PSA   . Incomplete bladder emptying   . Hyperlipidemia   . Hypertension   . Obstructive sleep apnea   . Chronic insomnia   . Metabolic syndrome   . Lumbago   . Post-polio syndrome   . Insomnia   . Hyperglycemia   . Androgen deficiency   . Scrotal pain   . Erectile dysfunction   . Epididymal cyst   . Over weight   . Gross hematuria   . Hydrocele, right   . Abnormal ECG     Surgical History: Past Surgical History  Procedure Laterality Date  . Ankle fusion Right 05/2009  . Tendon graft Right 2009    Hand after Lacertion Injury   . Inguinal hernia repair    . Lumbar laminectomy    . Right leg surgery      from polio age 29    Home Medications:    Medication List       This list is accurate as of: 11/10/14 11:22 AM.  Always use your most recent med list.               amLODipine-benazepril 10-20 MG capsule  Commonly known as:  LOTREL  Take 1 capsule by mouth daily.     amoxicillin-clavulanate 875-125 MG tablet  Commonly known as:  AUGMENTIN  Take 1 tablet by mouth 2 (two)  times daily.     aspirin 81 MG tablet  Take 1 tablet by mouth daily.     atorvastatin 40 MG tablet  Commonly known as:  LIPITOR  Take 1 tablet (40 mg total) by mouth daily.     benzonatate 200 MG capsule  Commonly known as:  TESSALON  Take 1 capsule (200 mg total) by mouth 3 (three) times daily as needed for cough.     CIALIS 20 MG tablet  Generic drug:  tadalafil  Take 1 tablet by mouth as needed.        Allergies:  Allergies  Allergen Reactions  . Duloxetine Hcl     Family History: Family History  Problem Relation Age of Onset  . Hyperlipidemia Mother   . Heart disease Father   . Heart disease Sister     ATF  . Prostate cancer Neg Hx   . Kidney disease Neg Hx     Social History:  reports that he quit smoking about 21 years ago. His smoking use included Cigarettes. He quit after 25 years of use. He has never used smokeless  tobacco. He reports that he drinks alcohol. He reports that he does not use illicit drugs.  ROS: UROLOGY Frequent Urination?: No Hard to postpone urination?: No Burning/pain with urination?: No Get up at night to urinate?: No Leakage of urine?: Yes Urine stream starts and stops?: Yes Trouble starting stream?: No Do you have to strain to urinate?: No Blood in urine?: No Urinary tract infection?: No Sexually transmitted disease?: No Injury to kidneys or bladder?: No Painful intercourse?: No Weak stream?: No Erection problems?: No Penile pain?: No  Gastrointestinal Nausea?: No Vomiting?: No Indigestion/heartburn?: No Diarrhea?: No Constipation?: No  Constitutional Fever: No Night sweats?: No Weight loss?: No Fatigue?: No  Skin Skin rash/lesions?: No Itching?: No  Eyes Blurred vision?: No Double vision?: No  Ears/Nose/Throat Sore throat?: No Sinus problems?: No  Hematologic/Lymphatic Swollen glands?: No Easy bruising?: No  Cardiovascular Leg swelling?: No Chest pain?: No  Respiratory Cough?: No Shortness of  breath?: No  Endocrine Excessive thirst?: No  Musculoskeletal Back pain?: No Joint pain?: No  Neurological Headaches?: No Dizziness?: No  Psychologic Depression?: No Anxiety?: No  Physical Exam: BP 139/76 mmHg  Pulse 91  Ht 5\' 9"  (1.753 m)  Wt 267 lb 14.4 oz (121.519 kg)  BMI 39.54 kg/m2   Laboratory Data:   Lab Results  Component Value Date   CREATININE 0.94 09/30/2014    Lab Results  Component Value Date   PSA 4.3* 10/17/2014   PSA 4.3* 10/17/2014   PSA Normal 02/25/2014    Lab Results  Component Value Date   HGBA1C 5.9* 09/30/2014     Assessment & Plan:    1. Elevated PSA:   We discussed the risks of undergoing a prostate biopsy at this time.  He is not anxious to undergo a biopsy at this time.  I advised him at this time we could not be certain that a prostate cancer did not exist.  I also explained that if a prostate cancer was present, it may be a low-grade cancer which would be managed with active surveillance.  I suggested since he presented to the office today to repeat the PSA again since spent 30 days since his last blood work was taken.   If it has risen precipitously in the last 30 days, we will pursue a biopsy. If it remains at 4.3 or lower, we will continue monitoring closely and he will return in 6 months.  - PSA  Greater than 50% was spent in counseling & coordination of care with the patient.  Return for pending PSA.  Michiel CowboySHANNON Gayanne Prescott, PA-C  [facility] [address], [address] [place], [place] [id number] [phone]

## 2014-11-10 NOTE — Telephone Encounter (Signed)
Spoke with pt who stated he was in our office today for a visit with Carollee HerterShannon.

## 2014-11-11 ENCOUNTER — Telehealth: Payer: Self-pay

## 2014-11-11 LAB — PSA: Prostate Specific Ag, Serum: 3.5 ng/mL (ref 0.0–4.0)

## 2014-11-11 NOTE — Telephone Encounter (Signed)
-----   Message from Harle BattiestShannon A McGowan, PA-C sent at 11/11/2014  9:11 AM EDT ----- PSA is actually declined from 4.3-3.5.  We will see him in 6 months.

## 2014-11-11 NOTE — Telephone Encounter (Signed)
Spoke with pt in reference to psa results. Pt voiced understanding.  

## 2015-03-30 ENCOUNTER — Ambulatory Visit (INDEPENDENT_AMBULATORY_CARE_PROVIDER_SITE_OTHER): Payer: Medicare Other | Admitting: Family Medicine

## 2015-03-30 ENCOUNTER — Encounter: Payer: Self-pay | Admitting: Family Medicine

## 2015-03-30 VITALS — BP 130/60 | HR 101 | Temp 97.1°F | Resp 14 | Ht 69.0 in | Wt 238.9 lb

## 2015-03-30 DIAGNOSIS — Z8612 Personal history of poliomyelitis: Secondary | ICD-10-CM | POA: Diagnosis not present

## 2015-03-30 DIAGNOSIS — R7309 Other abnormal glucose: Secondary | ICD-10-CM | POA: Diagnosis not present

## 2015-03-30 DIAGNOSIS — G4733 Obstructive sleep apnea (adult) (pediatric): Secondary | ICD-10-CM | POA: Diagnosis not present

## 2015-03-30 DIAGNOSIS — E669 Obesity, unspecified: Secondary | ICD-10-CM

## 2015-03-30 DIAGNOSIS — G14 Postpolio syndrome: Secondary | ICD-10-CM | POA: Diagnosis not present

## 2015-03-30 DIAGNOSIS — E559 Vitamin D deficiency, unspecified: Secondary | ICD-10-CM | POA: Diagnosis not present

## 2015-03-30 DIAGNOSIS — I10 Essential (primary) hypertension: Secondary | ICD-10-CM | POA: Diagnosis not present

## 2015-03-30 DIAGNOSIS — R194 Change in bowel habit: Secondary | ICD-10-CM

## 2015-03-30 DIAGNOSIS — R198 Other specified symptoms and signs involving the digestive system and abdomen: Secondary | ICD-10-CM

## 2015-03-30 DIAGNOSIS — E785 Hyperlipidemia, unspecified: Secondary | ICD-10-CM

## 2015-03-30 LAB — POCT GLYCOSYLATED HEMOGLOBIN (HGB A1C): HEMOGLOBIN A1C: 5.3

## 2015-03-30 MED ORDER — ATORVASTATIN CALCIUM 40 MG PO TABS: 40.0000 mg | ORAL_TABLET | Freq: Every day | ORAL | Status: DC

## 2015-03-30 MED ORDER — AMLODIPINE BESY-BENAZEPRIL HCL 10-20 MG PO CAPS: 1.0000 | ORAL_CAPSULE | Freq: Every day | ORAL | Status: DC

## 2015-03-30 NOTE — Addendum Note (Signed)
Addended by: Alba CorySOWLES, Manami Tutor F on: 03/30/2015 10:09 AM   Modules accepted: Orders, SmartSet

## 2015-03-30 NOTE — Progress Notes (Addendum)
Name: Shane Bowen   MRN: [id number]    DOB: June 26, 1945   Date:03/30/2015       Progress Note  Subjective  Chief Complaint  Chief Complaint  Patient presents with  . Hypertension    patient is here for his 58-month f/u  . Apnea  . Follow-up    dyslipidemia and vitamin d def.  . Hyperglycemia    HPI  OSA: he wears his CPAP every night,  ESS 6 months ago was  high at 15, since he has lost weight it is down to 6, continue current pressure.   HTN: bp is at goal, he has been losing weight. So far he denies hypotensive episodes. BP at home is around 139-111//70-92 at home. He denies chest pain or palpitation  Dyslipidemia: lipid panel looked normal, but his ASCVD was 14% and he has been on statin therapy, denies side effects. No chest pain.  Hyperglycemia.Obesity: doing great, lost 30 lbs in the past 3 months. He states he is actively trying to lose weight. Stopped eating after 8 pm, avoiding carbohydrates, more balanced diet, checking weight daily instead of weekly, drinking more water and decreasing portion size.  He is very happy with results. Feeling better, no health complains  History of poliomyelitis: gait problems and atrophy of right leg and is stable  Patient Active Problem List   Diagnosis Date Noted  . Elevated PSA 10/17/2014  . History of hematuria 10/17/2014  . Erectile dysfunction of organic origin 10/17/2014  . History of poliomyelitis 09/30/2014  . Gait instability 09/30/2014  . 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 Umbilical hernia without obstruction or gangrene 09/30/2014  . Kidney cysts 07/28/2014  . BPH (benign prostatic hyperplasia) 07/28/2014  . Insomnia, persistent 07/28/2014  . Dyslipidemia 07/28/2014  . Impotence of organic origin 07/28/2014  . Essential (primary) hypertension 07/28/2014  . Blood glucose elevated 07/28/2014  . Male hypogonadism 07/28/2014  . Obesity (BMI 30-39.9) 07/28/2014  . Obstructive apnea 07/28/2014  . Spermatocele  07/28/2014  . Vitamin D deficiency 11/10/2008    Past Surgical History  Procedure Laterality Date  . Ankle fusion Right 05/2009  . Tendon graft Right 2009    Hand after Lacertion Injury   . Inguinal hernia repair    . Lumbar laminectomy    . Right leg surgery      from polio age 9    Family History  Problem Relation Age of Onset  . Hyperlipidemia Mother   . Heart disease Father   . Heart disease Sister     ATF  . Prostate cancer Neg Hx   . Kidney disease Neg Hx     Social History   Social History  . Marital Status: Married    Spouse Name: N/A  . Number of Children: N/A  . Years of Education: N/A   Occupational History  . Not on file.   Social History Main Topics  . Smoking status: Former Smoker -- 25 years    Types: Cigarettes    Quit date: 01/21/1993  . Smokeless tobacco: Never Used  . Alcohol Use: 0.0 oz/week    0 Standard drinks or equivalent per week     Comment: occasionally  . Drug Use: No  . Sexual Activity: Not Currently   Other Topics Concern  . Not on file   Social History Narrative     Current outpatient prescriptions:  .  amLODipine-benazepril (LOTREL) 10-20 MG capsule, Take 1 capsule by mouth daily., Disp: 30 capsule, Rfl: 5 .  aspirin 81 MG tablet, Take 1 tablet by mouth daily., Disp: , Rfl:  .  atorvastatin (LIPITOR) 40 MG tablet, Take 1 tablet (40 mg total) by mouth daily., Disp: 30 tablet, Rfl: 5  Allergies  Allergen Reactions  . Duloxetine Hcl      ROS  Constitutional: Negative for fever, positive for  weight change.  Respiratory: Negative for cough and shortness of breath.   Cardiovascular: Negative for chest pain or palpitations.  Gastrointestinal: Negative for abdominal pain, mild change in bowel movement, had constipation but took Benifiber and it resolved - changed his diet Musculoskeletal: Positive  for gait problem ( history of polio - uses a cane) or joint swelling.  Skin: Negative for rash.  Neurological: Negative for  dizziness or headache.  No other specific complaints in a complete review of systems (except as listed in HPI above).  Objective  Filed Vitals:   03/30/15 0908  BP: 130/60  Pulse: 101  Temp: 97.1 F (36.2 C)  TempSrc: Oral  Resp: 14  Height: 5\' 9"  (1.753 m)  Weight: 238 lb 14.4 oz (108.364 kg)  SpO2: 95%    Body mass index is 35.26 kg/(m^2).  Physical Exam   Constitutional: Patient appears well-developed and well-nourished. Obese  No distress.  HEENT: head atraumatic, normocephalic, pupils equal and reactive to light,  neck supple, throat within normal limits Cardiovascular: Normal rate, regular rhythm and normal heart sounds.  No murmur heard. No BLE edema. Pulmonary/Chest: Effort normal and breath sounds normal. No respiratory distress. Abdominal: Soft.  There is no tenderness. Psychiatric: Patient has a normal mood and affect. behavior is normal. Judgment and thought content normal. Muscular Skeletal: right leg atrophy, uses a cane to help with ambulation   Recent Results (from the past 2160 hour(s))  POCT glycosylated hemoglobin (Hb A1C)     Status: Normal   Collection Time: 03/30/15  9:10 AM  Result Value Ref Range   Hemoglobin A1C 5.3      PHQ2/9: Depression screen [facility] 03/30/2015 09/30/2014 07/29/2014  Decreased Interest 0 0 0  Down, Depressed, Hopeless 0 0 0  PHQ - 2 Score 0 0 0     Fall Risk: Fall Risk  03/30/2015 09/30/2014 07/29/2014  Falls in the past year? No Yes Yes  Number falls in past yr: - 1 1  Injury with Fall? - No No     Functional Status Survey: Is the patient deaf or have difficulty hearing?: No Does the patient have difficulty seeing, even when wearing glasses/contacts?: No Does the patient have difficulty concentrating, remembering, or making decisions?: No Does the patient have difficulty walking or climbing stairs?: No Does the patient have difficulty dressing or bathing?: No Does the patient have difficulty doing errands alone such as  visiting a doctor's office or shopping?: No    Assessment & Plan  1. Essential (primary) hypertension  Call back if orthostatic changes for decrease dose of Lotrel  - amLODipine-benazepril (LOTREL) 10-20 MG capsule; Take 1 capsule by mouth daily.  Dispense: 30 capsule; Refill: 5  2. Elevated glucose level  Doing great, glucose back to normal with weight loss - POCT glycosylated hemoglobin (Hb A1C)  3. Dyslipidemia  - atorvastatin (LIPITOR) 40 MG tablet; Take 1 tablet (40 mg total) by mouth daily.  Dispense: 30 tablet; Refill: 5  4. Vitamin D deficiency  Continue otc vitamin   5. History of poliomyelitis  stable  6. Obstructive apnea  Continue CPAP at current pressure  7. Post-polio syndrome  8. Obesity (BMI 30-39.9)  continue life style changes. Doing great, offered to check labs, but is actively trying to lose weight and wants to hold off until next visit   9. Change in bowel movement  - Ambulatory referral to Gastroenterology

## 2015-04-06 ENCOUNTER — Telehealth: Payer: Self-pay

## 2015-04-06 ENCOUNTER — Other Ambulatory Visit: Payer: Self-pay

## 2015-04-06 NOTE — Telephone Encounter (Signed)
Gastroenterology Pre-Procedure Review  Request Date: 04/27/15 Requesting Physician: Dr. Carlynn PurlSowles  PATIENT REVIEW QUESTIONS: The patient responded to the following health history questions as indicated:    1. Are you having any GI issues? no 2. Do you have a personal history of Polyps? no 3. Do you have a family history of Colon Cancer or Polyps? no 4. Diabetes Mellitus? no 5. Joint replacements in the past 12 months?no 6. Major health problems in the past 3 months?no 7. Any artificial heart valves, MVP, or defibrillator?no    MEDICATIONS & ALLERGIES:    Patient reports the following regarding taking any anticoagulation/antiplatelet therapy:   Plavix, Coumadin, Eliquis, Xarelto, Lovenox, Pradaxa, Brilinta, or Effient? no Aspirin? yes (ASA 81mg )  Patient confirms/reports the following medications:  Current Outpatient Prescriptions  Medication Sig Dispense Refill  . amLODipine-benazepril (LOTREL) 10-20 MG capsule Take 1 capsule by mouth daily. 30 capsule 5  . aspirin 81 MG tablet Take 1 tablet by mouth daily.    Marland Kitchen. atorvastatin (LIPITOR) 40 MG tablet Take 1 tablet (40 mg total) by mouth daily. 30 tablet 5   No current facility-administered medications for this visit.    Patient confirms/reports the following allergies:  Allergies  Allergen Reactions  . Duloxetine Hcl     No orders of the defined types were placed in this encounter.    AUTHORIZATION INFORMATION Primary Insurance: 1D#: Group #:  Secondary Insurance: 1D#: Group #:  SCHEDULE INFORMATION: Date: 04/27/15 Time: Location: [facility]

## 2015-04-11 ENCOUNTER — Other Ambulatory Visit: Payer: Medicare Other

## 2015-04-11 DIAGNOSIS — R972 Elevated prostate specific antigen [PSA]: Secondary | ICD-10-CM

## 2015-04-12 LAB — PSA: Prostate Specific Ag, Serum: 3.9 ng/mL (ref 0.0–4.0)

## 2015-04-17 ENCOUNTER — Ambulatory Visit (INDEPENDENT_AMBULATORY_CARE_PROVIDER_SITE_OTHER): Payer: Medicare Other | Admitting: Urology

## 2015-04-17 ENCOUNTER — Encounter: Payer: Self-pay | Admitting: Urology

## 2015-04-17 VITALS — BP 114/72 | HR 92 | Ht 69.0 in | Wt 239.2 lb

## 2015-04-17 DIAGNOSIS — Z87448 Personal history of other diseases of urinary system: Secondary | ICD-10-CM

## 2015-04-17 DIAGNOSIS — N432 Other hydrocele: Secondary | ICD-10-CM

## 2015-04-17 DIAGNOSIS — N528 Other male erectile dysfunction: Secondary | ICD-10-CM

## 2015-04-17 DIAGNOSIS — Z87898 Personal history of other specified conditions: Secondary | ICD-10-CM

## 2015-04-17 DIAGNOSIS — N138 Other obstructive and reflux uropathy: Secondary | ICD-10-CM

## 2015-04-17 DIAGNOSIS — N401 Enlarged prostate with lower urinary tract symptoms: Secondary | ICD-10-CM | POA: Diagnosis not present

## 2015-04-17 DIAGNOSIS — N529 Male erectile dysfunction, unspecified: Secondary | ICD-10-CM

## 2015-04-17 LAB — URINALYSIS, COMPLETE
BILIRUBIN UA: NEGATIVE
Glucose, UA: NEGATIVE
LEUKOCYTES UA: NEGATIVE
NITRITE UA: NEGATIVE
PH UA: 5 (ref 5.0–7.5)
PROTEIN UA: NEGATIVE
RBC UA: NEGATIVE
Specific Gravity, UA: 1.025 (ref 1.005–1.030)
UUROB: 0.2 mg/dL (ref 0.2–1.0)

## 2015-04-17 LAB — MICROSCOPIC EXAMINATION: RBC MICROSCOPIC, UA: NONE SEEN /HPF (ref 0–?)

## 2015-04-17 NOTE — Progress Notes (Signed)
04/17/2015 9:32 AM   Shane Bowen 1945/08/13 [id number]  Referring provider: Alba Cory, MD [address] [place], [place] [address]  Chief Complaint  Patient presents with  . Benign Prostatic Hypertrophy    6 month follow up  . Elevated PSA    HPI: Patient is a 70 year old Caucasian male with a history of erectile dysfunction, BPH with LUTS, history of elevated PSA, history of gross hematuria and hydrocele who presents today for 6 month follow-up.  Erectile dysfunction  Patient is not sexually active at this time.  BPH WITH LUTS His IPSS score today is 7, which is mild lower urinary tract symptomatology. He is pleased with his quality life due to his urinary symptoms.  He denies any dysuria, hematuria or suprapubic pain.  He also denies any recent fevers, chills, nausea or vomiting.    He does not have a family history of PCa.      IPSS      04/17/15 0900       International Prostate Symptom Score   How often have you had the sensation of not emptying your bladder? Less than 1 in 5     How often have you had to urinate less than every two hours? Less than 1 in 5 times     How often have you found you stopped and started again several times when you urinated? Less than half the time     How often have you found it difficult to postpone urination? Not at All     How often have you had a weak urinary stream? Less than 1 in 5 times     How often have you had to strain to start urination? Less than 1 in 5 times     How many times did you typically get up at night to urinate? 1 Time     Total IPSS Score 7     Quality of Life due to urinary symptoms   If you were to spend the rest of your life with your urinary condition just the way it is now how would you feel about that? Pleased        Score:  1-7 Mild 8-19 Moderate 20-35 Severe  Elevated PSA Patient had an elevated PSA of 4.5 ng/mL on 10/03/2011. His PSA's have returned below 4 since that  time. His latest PSA was 3.9 ng/mL on 04/11/2015.  History of gross hematuria Patient underwent workup for gross hematuria in 04/2013 with CT urogram and cystoscopy. He was found to have bilateral renal cysts, an enlarged prostate and hydrocele. His UA today did not demonstrate any microscopic hematuria and he did not endorse any recent gross hematuria.  Right hydrocele Patient is losing weight and may want his hydrocele addressed in the future.  We will discuss this when he returns in 6 months.      PMH: Past Medical History  Diagnosis Date  . Vitamin D deficiency   . Bilateral renal cysts   . BPH with elevated PSA   . Incomplete bladder emptying   . Hyperlipidemia   . Hypertension   . Obstructive sleep apnea   . Chronic insomnia   . Metabolic syndrome   . Lumbago   . Post-polio syndrome   . Insomnia   . Hyperglycemia   . Androgen deficiency   . Scrotal pain   . Erectile dysfunction   . Epididymal cyst   . Over weight   . Gross hematuria   .  Hydrocele, right   . Abnormal ECG     Surgical History: Past Surgical History  Procedure Laterality Date  . Ankle fusion Right 05/2009  . Tendon graft Right 2009    Hand after Lacertion Injury   . Inguinal hernia repair    . Lumbar laminectomy    . Right leg surgery      from polio age 71    Home Medications:    Medication List       This list is accurate as of: 04/17/15  9:32 AM.  Always use your most recent med list.               amLODipine-benazepril 10-20 MG capsule  Commonly known as:  LOTREL  Take 1 capsule by mouth daily.     aspirin 81 MG tablet  Take 1 tablet by mouth daily.     atorvastatin 40 MG tablet  Commonly known as:  LIPITOR  Take 1 tablet (40 mg total) by mouth daily.     saw palmetto 500 MG capsule  Take 500 mg by mouth daily.        Allergies:  Allergies  Allergen Reactions  . Duloxetine Hcl     Family History: Family History  Problem Relation Age of Onset  . Hyperlipidemia  Mother   . Heart disease Father   . Heart disease Sister     ATF  . Prostate cancer Neg Hx   . Kidney disease Neg Hx     Social History:  reports that he quit smoking about 22 years ago. His smoking use included Cigarettes. He quit after 25 years of use. He has never used smokeless tobacco. He reports that he drinks alcohol. He reports that he does not use illicit drugs.  ROS: UROLOGY Frequent Urination?: No Hard to postpone urination?: No Burning/pain with urination?: No Get up at night to urinate?: No Leakage of urine?: No Urine stream starts and stops?: Yes Trouble starting stream?: No Do you have to strain to urinate?: Yes Blood in urine?: No Urinary tract infection?: No Sexually transmitted disease?: No Injury to kidneys or bladder?: No Painful intercourse?: No Weak stream?: No Erection problems?: No Penile pain?: No  Gastrointestinal Nausea?: No Vomiting?: No Indigestion/heartburn?: No Diarrhea?: No Constipation?: No  Constitutional Fever: No Night sweats?: No Weight loss?: No Fatigue?: No  Skin Skin rash/lesions?: No Itching?: No  Eyes Blurred vision?: No Double vision?: No  Ears/Nose/Throat Sore throat?: No Sinus problems?: No  Hematologic/Lymphatic Swollen glands?: No Easy bruising?: No  Cardiovascular Leg swelling?: No Chest pain?: No  Respiratory Cough?: No Shortness of breath?: No  Endocrine Excessive thirst?: No  Musculoskeletal Back pain?: No Joint pain?: No  Neurological Headaches?: No Dizziness?: No  Psychologic Depression?: No Anxiety?: No  Physical Exam: BP 114/72 mmHg  Pulse 92  Ht  (1.753 m)  Wt 239 lb 3.2 oz (108.5 kg)  BMI 35.31 kg/m2  Constitutional: Well nourished. Alert and oriented, No acute distress. HEENT: [place] AT, moist mucus membranes. Trachea midline, no masses. Cardiovascular: No clubbing, cyanosis, or edema. Respiratory: Normal respiratory effort, no increased work of breathing. GI:  Abdomen is soft, non tender, non distended, no abdominal masses. Liver and spleen not palpable.  No hernias appreciated.  Stool sample for occult testing is not indicated.   GU: No CVA tenderness.  No bladder fullness or masses.  Patient with circumcised phallus.   Urethral meatus is patent.  No penile discharge. No penile lesions or rashes. Scrotum without lesions, cysts,  rashes and/or edema.  Right hydrocele.  Testicles are located scrotally bilaterally. No masses are appreciated in the testicles. Left and right epididymis are normal. Rectal: Patient with  normal sphincter tone. Anus and perineum without scarring or rashes. No rectal masses are appreciated. Prostate is approximately 55 grams, no nodules are appreciated. Seminal vesicles are normal. Skin: No rashes, bruises or suspicious lesions. Lymph: No cervical or inguinal adenopathy. Neurologic: Grossly intact, no focal deficits, moving all 4 extremities. Psychiatric: Normal mood and affect.  Laboratory Data:  Lab Results  Component Value Date   CREATININE 0.94 09/30/2014   Lab Results  Component Value Date   HGBA1C 5.3 03/30/2015      Component Value Date/Time   CHOL 130 09/30/2014 0949   CHOL 168 03/29/2014   HDL 53 09/30/2014 0949   HDL 49 03/29/2014   CHOLHDL 2.5 09/30/2014 0949   LDLCALC 54 09/30/2014 0949   LDLCALC 98 03/29/2014   Lab Results  Component Value Date   AST 24 09/30/2014   Lab Results  Component Value Date   ALT 39 09/30/2014   PSA History  4.3 ng/mL on 10/17/2014  3.5 ng/mL on 11/10/2014  3.9 ng/mL on 04/11/2015  Urinalysis Results for orders placed or performed in visit on 04/17/15  Microscopic Examination  Result Value Ref Range   WBC, UA 0-5 0 -  5 /hpf   RBC, UA None seen 0 -  2 /hpf   Epithelial Cells (non renal) 0-10 0 - 10 /hpf   Mucus, UA Present (A) Not Estab.   Bacteria, UA Few (A) None seen/Few  Urinalysis, Complete  Result Value Ref Range   Specific Gravity, UA 1.025 1.005 -  1.030   pH, UA 5.0 5.0 - 7.5   Color, UA Yellow Yellow   Appearance Ur Clear Clear   Leukocytes, UA Negative Negative   Protein, UA Negative Negative/Trace   Glucose, UA Negative Negative   Ketones, UA 2+ (A) Negative   RBC, UA Negative Negative   Bilirubin, UA Negative Negative   Urobilinogen, Ur 0.2 0.2 - 1.0 mg/dL   Nitrite, UA Negative Negative   Microscopic Examination See below:     Pertinent Imaging: CLINICAL DATA: Follow-up of known right hydrocele   EXAM:  SCROTAL ULTRASOUND   DOPPLER ULTRASOUND OF THE TESTICLES   TECHNIQUE:  Complete ultrasound examination of the testicles, epididymis, and  other scrotal structures was performed. Color and spectral Doppler  ultrasound were also utilized to evaluate blood flow to the  testicles.   COMPARISON: CT scan of the abdomen and pelvis of 28 April 2013 which  included a portion of the scrotum and pelvic ultrasound of March 31, 2012   FINDINGS:  Right testicle   Measurements: 4.1 x 3.1 x 2.6 cm. No mass or microlithiasis  visualized.   Left testicle   Measurements: 5.3 x 3.2 x 2.4 cm. No mass or microlithiasis  visualized.   Right epididymis: Normal in size and appearance.   Left epididymis: Normal in size and appearance.   Hydrocele: There is a moderate-sized right hydrocele measuring 6.6 x  8.4 x 9.6 cm there is no hydrocele on the left.   Varicocele: None visualized.   Pulsed Doppler interrogation of both testes demonstrates normal low  resistance arterial and venous waveforms bilaterally.   IMPRESSION:  1. Right-sided hydrocele. Within the limits of the comparison  studies it has not significantly changed in size.  2. The testes and epididymal structures are normal in echotexture  and  vascularity.    Electronically Signed   By: David Swaziland   On: 04/22/2014 13:38    Assessment & Plan:    1. BPH (benign prostatic hyperplasia) with LUTS:    IPSS score is 7/1.  We will continue to monitor.  He will have his IPSS score, exam and PSA in 6 months.  - Urinalysis, Complete  2. Erectile dysfunction:   Patient is not currently sexually active at this time.  We will reassess when he RTC in 6 months.    3. Hydrocele:   Patient is not interested in a surgical correction of his hydrocele at this time.  He is currently dieting and losing weight.  He may want to pursue a hydrocelectomy in the future.  He would like to discuss it again when he returns in 6 months.  4. History of gross hematuria: Patient underwent workup for gross hematuria in 04/2013 with CT urogram and cystoscopy. He was found to have bilateral renal cysts, an enlarged prostate and hydrocele. He UA did not demonstrate any microscopic hematuria and he did not endorse any gross hematuria. He will be RTC in 6 months and we will recheck an UA.   5. History of elevated PSA: Patient had an elevated PSA of 4.5 ng/mL on 10/03/2011. His PSA's have returned below 4 since that time. His latest PSA was 3.9 ng/mL on 04/11/2015. He would like to continue biannual monitoring. He will return in 6 months for PSA and DRE.   Return in about 6 months (around 10/18/2015) for IPSS score and exam.  These notes generated with voice recognition software. I apologize for typographical errors.  Michiel Cowboy, PA-C  [facility] [address] [place], [place] [address] [phone]

## 2015-04-20 ENCOUNTER — Encounter: Payer: Self-pay | Admitting: *Deleted

## 2015-04-25 NOTE — Discharge Instructions (Signed)

## 2015-04-27 ENCOUNTER — Ambulatory Visit: Payer: Medicare Other | Admitting: Anesthesiology

## 2015-04-27 ENCOUNTER — Encounter
Admission: RE | Disposition: A | Discharge status: Home / Self Care | Payer: Self-pay | Source: Ambulatory Visit | Attending: Gastroenterology

## 2015-04-27 ENCOUNTER — Ambulatory Visit
Admission: RE | Admit: 2015-04-27 | Discharge: 2015-04-27 | Disposition: A | Discharge status: Home / Self Care | Payer: Medicare Other | Source: Ambulatory Visit | Attending: Gastroenterology | Admitting: Gastroenterology

## 2015-04-27 DIAGNOSIS — Z981 Arthrodesis status: Secondary | ICD-10-CM | POA: Insufficient documentation

## 2015-04-27 DIAGNOSIS — G4733 Obstructive sleep apnea (adult) (pediatric): Secondary | ICD-10-CM | POA: Diagnosis not present

## 2015-04-27 DIAGNOSIS — K5790 Diverticulosis of intestine, part unspecified, without perforation or abscess without bleeding: Secondary | ICD-10-CM | POA: Diagnosis not present

## 2015-04-27 DIAGNOSIS — K641 Second degree hemorrhoids: Secondary | ICD-10-CM | POA: Diagnosis not present

## 2015-04-27 DIAGNOSIS — Z1211 Encounter for screening for malignant neoplasm of colon: Secondary | ICD-10-CM | POA: Insufficient documentation

## 2015-04-27 DIAGNOSIS — F5104 Psychophysiologic insomnia: Secondary | ICD-10-CM | POA: Insufficient documentation

## 2015-04-27 DIAGNOSIS — K5730 Diverticulosis of large intestine without perforation or abscess without bleeding: Secondary | ICD-10-CM

## 2015-04-27 DIAGNOSIS — N400 Enlarged prostate without lower urinary tract symptoms: Secondary | ICD-10-CM | POA: Diagnosis not present

## 2015-04-27 DIAGNOSIS — E785 Hyperlipidemia, unspecified: Secondary | ICD-10-CM | POA: Insufficient documentation

## 2015-04-27 DIAGNOSIS — G4700 Insomnia, unspecified: Secondary | ICD-10-CM | POA: Insufficient documentation

## 2015-04-27 DIAGNOSIS — Z79899 Other long term (current) drug therapy: Secondary | ICD-10-CM | POA: Insufficient documentation

## 2015-04-27 DIAGNOSIS — Z888 Allergy status to other drugs, medicaments and biological substances status: Secondary | ICD-10-CM | POA: Diagnosis not present

## 2015-04-27 DIAGNOSIS — G4730 Sleep apnea, unspecified: Secondary | ICD-10-CM | POA: Diagnosis not present

## 2015-04-27 DIAGNOSIS — Z7982 Long term (current) use of aspirin: Secondary | ICD-10-CM | POA: Diagnosis not present

## 2015-04-27 DIAGNOSIS — I10 Essential (primary) hypertension: Secondary | ICD-10-CM | POA: Insufficient documentation

## 2015-04-27 DIAGNOSIS — Z87891 Personal history of nicotine dependence: Secondary | ICD-10-CM | POA: Diagnosis not present

## 2015-04-27 HISTORY — DX: Inflammatory liver disease, unspecified: K75.9

## 2015-04-27 HISTORY — PX: COLONOSCOPY WITH PROPOFOL: SHX5780

## 2015-04-27 SURGERY — COLONOSCOPY WITH PROPOFOL
Anesthesia: Monitor Anesthesia Care | Wound class: Contaminated

## 2015-04-27 MED ORDER — PROPOFOL 10 MG/ML IV BOLUS
INTRAVENOUS | Status: DC | PRN
  Administered 2015-04-27 (×9): 20 mg via INTRAVENOUS
  Administered 2015-04-27: 40 mg via INTRAVENOUS
  Administered 2015-04-27: 100 mg via INTRAVENOUS
  Administered 2015-04-27: 20 mg via INTRAVENOUS

## 2015-04-27 MED ORDER — STERILE WATER FOR IRRIGATION IR SOLN
Status: DC | PRN
  Administered 2015-04-27: 10:00:00

## 2015-04-27 MED ORDER — LACTATED RINGERS IV SOLN
INTRAVENOUS | Status: DC
  Administered 2015-04-27 (×2): via INTRAVENOUS

## 2015-04-27 MED ORDER — LIDOCAINE HCL (CARDIAC) 20 MG/ML IV SOLN
INTRAVENOUS | Status: DC | PRN
  Administered 2015-04-27: 20 mg via INTRAVENOUS

## 2015-04-27 SURGICAL SUPPLY — 21 items
CANISTER SUCT 1200ML W/VALVE (MISCELLANEOUS) ×3 IMPLANT
CLIP HMST 235XBRD CATH ROT (MISCELLANEOUS) IMPLANT
CLIP RESOLUTION 360 11X235 (MISCELLANEOUS)
FCP ESCP3.2XJMB 240X2.8X (MISCELLANEOUS)
FORCEPS BIOP RAD 4 LRG CAP 4 (CUTTING FORCEPS) IMPLANT
FORCEPS BIOP RJ4 240 W/NDL (MISCELLANEOUS)
FORCEPS ESCP3.2XJMB 240X2.8X (MISCELLANEOUS) IMPLANT
GOWN CVR UNV OPN BCK APRN NK (MISCELLANEOUS) ×2 IMPLANT
GOWN ISOL THUMB LOOP REG UNIV (MISCELLANEOUS) ×4
INJECTOR VARIJECT VIN23 (MISCELLANEOUS) IMPLANT
KIT DEFENDO VALVE AND CONN (KITS) IMPLANT
KIT ENDO PROCEDURE OLY (KITS) ×3 IMPLANT
MARKER SPOT ENDO TATTOO 5ML (MISCELLANEOUS) IMPLANT
PAD GROUND ADULT SPLIT (MISCELLANEOUS) IMPLANT
PROBE APC STR FIRE (PROBE) ×3 IMPLANT
SNARE SHORT THROW 13M SML OVAL (MISCELLANEOUS) IMPLANT
SNARE SHORT THROW 30M LRG OVAL (MISCELLANEOUS) IMPLANT
SPOT EX ENDOSCOPIC TATTOO (MISCELLANEOUS)
VARIJECT INJECTOR VIN23 (MISCELLANEOUS)
WATER STERILE IRR 250ML POUR (IV SOLUTION) ×3 IMPLANT
WIDE-EYE POLYPTRAP (MISCELLANEOUS) IMPLANT

## 2015-04-27 NOTE — Transfer of Care (Signed)
Immediate Anesthesia Transfer of Care Note  Patient: Shane Bowen  Procedure(s) Performed: Procedure(s) with comments: COLONOSCOPY WITH PROPOFOL (N/A) - CPAP  Patient Location: PACU  Anesthesia Type: MAC  Level of Consciousness: awake, alert  and patient cooperative  Airway and Oxygen Therapy: Patient Spontanous Breathing and Patient connected to supplemental oxygen  Post-op Assessment: Post-op Vital signs reviewed, Patient's Cardiovascular Status Stable, Respiratory Function Stable, Patent Airway and No signs of Nausea or vomiting  Post-op Vital Signs: Reviewed and stable  Complications: No apparent anesthesia complications

## 2015-04-27 NOTE — Anesthesia Preprocedure Evaluation (Signed)
Anesthesia Evaluation  Patient identified by MRN, date of birth, ID band Patient awake    Reviewed: Allergy & Precautions, H&P , NPO status , Patient's Chart, lab work & pertinent test results, reviewed documented beta blocker date and time   Airway Mallampati: II  TM Distance: >3 FB Neck ROM: full    Dental no notable dental hx.    Pulmonary sleep apnea and Continuous Positive Airway Pressure Ventilation , former smoker,    Pulmonary exam normal breath sounds clear to auscultation       Cardiovascular Exercise Tolerance: Good hypertension,  Rhythm:regular Rate:Normal     Neuro/Psych negative neurological ROS  negative psych ROS   GI/Hepatic negative GI ROS, Neg liver ROS,   Endo/Other  negative endocrine ROS  Renal/GU negative Renal ROS  negative genitourinary   Musculoskeletal   Abdominal   Peds  Hematology negative hematology ROS (+)   Anesthesia Other Findings   Reproductive/Obstetrics negative OB ROS                             Anesthesia Physical Anesthesia Plan  ASA: II  Anesthesia Plan: MAC   Post-op Pain Management:    Induction:   Airway Management Planned:   Additional Equipment:   Intra-op Plan:   Post-operative Plan:   Informed Consent: I have reviewed the patients History and Physical, chart, labs and discussed the procedure including the risks, benefits and alternatives for the proposed anesthesia with the patient or authorized representative who has indicated his/her understanding and acceptance.   Dental Advisory Given  Plan Discussed with: CRNA  Anesthesia Plan Comments:         Anesthesia Quick Evaluation

## 2015-04-27 NOTE — H&P (Signed)
[facility]  [address] [place], [place] [address] Phone: [phone] Fax : [phone]  Primary Care Physician:  Ruel FavorsKrichna F Sowles, MD Primary Gastroenterologist:  Dr. Servando SnareWohl  Pre-Procedure History & Physical: HPI:  Shane Bowen is a 70 y.o. male is here for a screening colonoscopy.   Past Medical History  Diagnosis Date  . Vitamin D deficiency   . Bilateral renal cysts   . BPH with elevated PSA   . Incomplete bladder emptying   . Hyperlipidemia   . Hypertension   . Chronic insomnia   . Metabolic syndrome   . Lumbago   . Post-polio syndrome   . Insomnia   . Hyperglycemia   . Androgen deficiency   . Scrotal pain   . Erectile dysfunction   . Epididymal cyst   . Over weight   . Gross hematuria   . Hydrocele, right   . Abnormal ECG   . Obstructive sleep apnea     CPAP  . Hepatitis     age 70     Past Surgical History  Procedure Laterality Date  . Ankle fusion Right 05/2009  . Tendon graft Right 2009    Hand after Lacertion Injury   . Inguinal hernia repair    . Lumbar laminectomy    . Right leg surgery      from polio age 89  . Colonoscopy      Prior to Admission medications   Medication Sig Start Date End Date Taking? Authorizing Provider  amLODipine-benazepril (LOTREL) 10-20 MG capsule Take 1 capsule by mouth daily. 03/30/15   Alba CoryKrichna Sowles, MD  aspirin 81 MG tablet Take 1 tablet by mouth daily. 05/12/07   Historical Provider, MD  atorvastatin (LIPITOR) 40 MG tablet Take 1 tablet (40 mg total) by mouth daily. 03/30/15   Alba CoryKrichna Sowles, MD  saw palmetto 500 MG capsule Take 500 mg by mouth daily.    Historical Provider, MD    Allergies as of 04/06/2015 - Review Complete 04/06/2015  Allergen Reaction Noted  . Duloxetine hcl  07/27/2014    Family History  Problem Relation Age of Onset  . Hyperlipidemia Mother   . Heart disease Father   . Heart disease Sister     ATF  . Prostate cancer Neg Hx   . Kidney disease Neg Hx      Social History   Social History  . Marital Status: Widowed    Spouse Name: N/A  . Number of Children: N/A  . Years of Education: N/A   Occupational History  . Not on file.   Social History Main Topics  . Smoking status: Former Smoker -- 25 years    Types: Cigarettes    Quit date: 01/21/1993  . Smokeless tobacco: Never Used  . Alcohol Use: 4.2 oz/week    7 Glasses of wine, 0 Standard drinks or equivalent per week     Comment:    . Drug Use: No  . Sexual Activity: Not Currently   Other Topics Concern  . Not on file   Social History Narrative    Review of Systems: See HPI, otherwise negative ROS  Physical Exam: Ht 5\' 9"  (1.753 m)  Wt 232 lb (105.235 kg)  BMI 34.24 kg/m2 General:   Alert,  pleasant and cooperative in NAD Head:  Normocephalic and atraumatic. Neck:  Supple; no masses or thyromegaly. Lungs:  Clear throughout to auscultation.    Heart:  Regular rate and rhythm. Abdomen:  Soft, nontender and nondistended.  Normal bowel sounds, without guarding, and without rebound.   Neurologic:  Alert and  oriented x4;  grossly normal neurologically.  Impression/Plan: Shane Bowen is now here to undergo a screening colonoscopy.  Risks, benefits, and alternatives regarding colonoscopy have been reviewed with the patient.  Questions have been answered.  All parties agreeable.

## 2015-04-27 NOTE — Anesthesia Postprocedure Evaluation (Signed)
Anesthesia Post Note  Patient: Shane Bowen  Procedure(s) Performed: Procedure(s) (LRB): COLONOSCOPY WITH PROPOFOL (N/A)  Patient location during evaluation: PACU Anesthesia Type: MAC Level of consciousness: awake and alert Pain management: pain level controlled Vital Signs Assessment: post-procedure vital signs reviewed and stable Respiratory status: spontaneous breathing, nonlabored ventilation, respiratory function stable and patient connected to nasal cannula oxygen Cardiovascular status: blood pressure returned to baseline and stable Postop Assessment: no signs of nausea or vomiting Anesthetic complications: no    Scarlette Sliceachel B Harold Moncus

## 2015-04-27 NOTE — Anesthesia Procedure Notes (Signed)
Procedure Name: MAC Performed by: Loan Oguin Pre-anesthesia Checklist: Patient identified, Emergency Drugs available, Suction available, Patient being monitored and Timeout performed Patient Re-evaluated:Patient Re-evaluated prior to inductionOxygen Delivery Method: Nasal cannula       

## 2015-04-27 NOTE — Op Note (Signed)
[facility] Gastroenterology Patient Name: Shane Bowen Procedure Date: 04/27/2015 9:22 AM MRN: [id number] Account #: [id number] Date of Birth: [birth year] Admit Type: Outpatient Age: 70 Room: [facility] OR ROOM 01 Gender: Male Note Status: Finalized Procedure:            Colonoscopy Indications:          Screening for colorectal malignant neoplasm Providers:            Midge Minium, MD Referring MD:         Onnie Boer. Sowles, MD (Referring MD) Medicines:            Propofol per Anesthesia Complications:        No immediate complications. Procedure:            Pre-Anesthesia Assessment:                       - Prior to the procedure, a History and Physical was                        performed, and patient medications and allergies were                        reviewed. The patient's tolerance of previous                        anesthesia was also reviewed. The risks and benefits of                        the procedure and the sedation options and risks were                        discussed with the patient. All questions were                        answered, and informed consent was obtained. Prior                        Anticoagulants: The patient has taken no previous                        anticoagulant or antiplatelet agents. ASA Grade                        Assessment: II - A patient with mild systemic disease.                        After reviewing the risks and benefits, the patient was                        deemed in satisfactory condition to undergo the                        procedure.                       After obtaining informed consent, the colonoscope was                        passed under direct vision. Throughout the procedure,  the patient's blood pressure, pulse, and oxygen                        saturations were monitored continuously. The Olympus CF                        H180AL colonoscope (S#: [id number]) was introduced through                         the anus and advanced to the the cecum, identified by                        appendiceal orifice and ileocecal valve. The                        colonoscopy was performed without difficulty. The                        patient tolerated the procedure well. The quality of                        the bowel preparation was excellent. Findings:      The perianal and digital rectal examinations were normal.      A few small-mouthed diverticula were found in the ascending colon.      Non-bleeding internal hemorrhoids were found during retroflexion. The       hemorrhoids were Grade II (internal hemorrhoids that prolapse but reduce       spontaneously). Impression:           - Diverticulosis in the ascending colon.                       - Non-bleeding internal hemorrhoids.                       - No specimens collected. Recommendation:       - Repeat colonoscopy in 10 years for screening unless                        any change in family history or lower GI problems. Procedure Code(s):    --- Professional ---                       [phone], Colonoscopy, flexible; diagnostic, including                        collection of specimen(s) by brushing or washing, when                        performed (separate procedure) Diagnosis Code(s):    --- Professional ---                       Z12.11, Encounter for screening for malignant neoplasm                        of colon CPT copyright 2016 [employer]. All rights reserved. The codes documented in this report are preliminary and upon coder review may  be revised to meet current compliance requirements. Midge Minium, MD 04/27/2015 9:41:37 AM This report has been signed electronically. Number of Addenda: 0  Note Initiated On: 04/27/2015 9:22 AM Scope Withdrawal Time: 0 hours 6 minutes 34 seconds  Total Procedure Duration: 0 hours 12 minutes 45 seconds       [facility]

## 2015-04-28 ENCOUNTER — Encounter: Payer: Self-pay | Admitting: Gastroenterology

## 2015-09-23 IMAGING — US US SCROTUM W/ DOPPLER COMPLETE
1 series · 13 of 25 positions shown · non-contrast
Comparison: CT scan of the abdomen and pelvis of 28 April, 2013 which
included a portion of the scrotum and pelvic ultrasound March 31, 2012

CLINICAL DATA: Follow-up of known right hydrocele

EXAM:
SCROTAL ULTRASOUND
DOPPLER ULTRASOUND OF THE TESTICLES
TECHNIQUE: Complete ultrasound examination of the testicles, epididymis, and
other scrotal structures was performed. Color and spectral Doppler
ultrasound were also utilized to evaluate blood flow to the
testicles.

[Series 1: us scrotum w/ doppler complete · 13 of 70 slices shown]
[im 1/70]
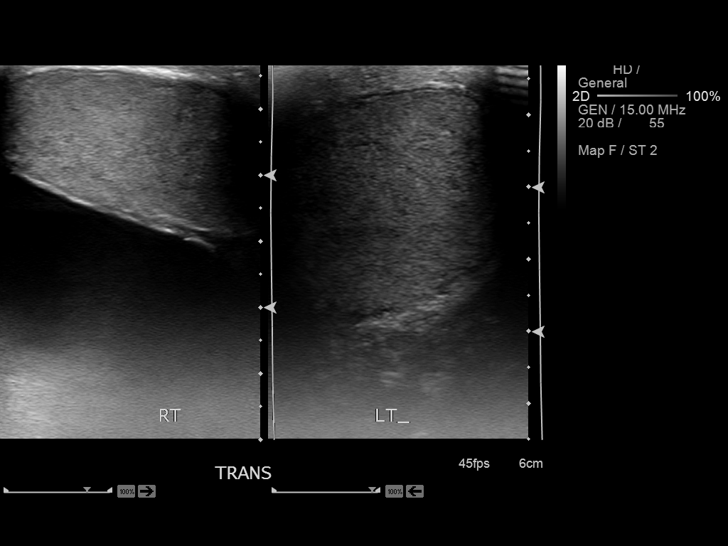
[im 6/70]
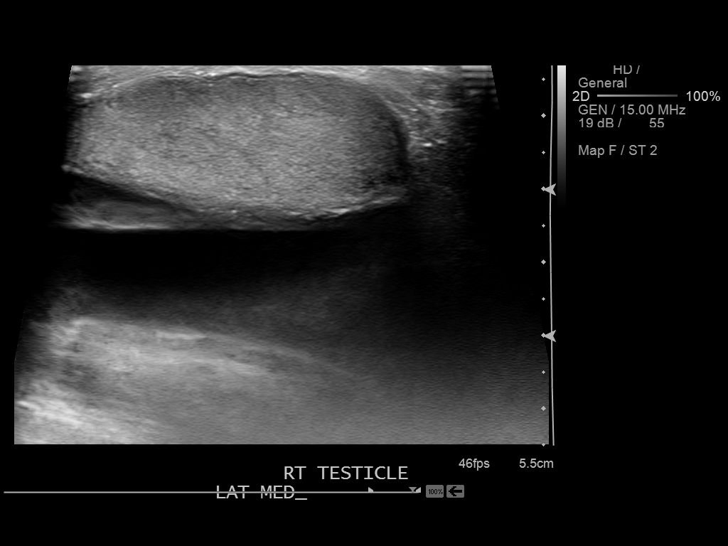
[im 12/70]
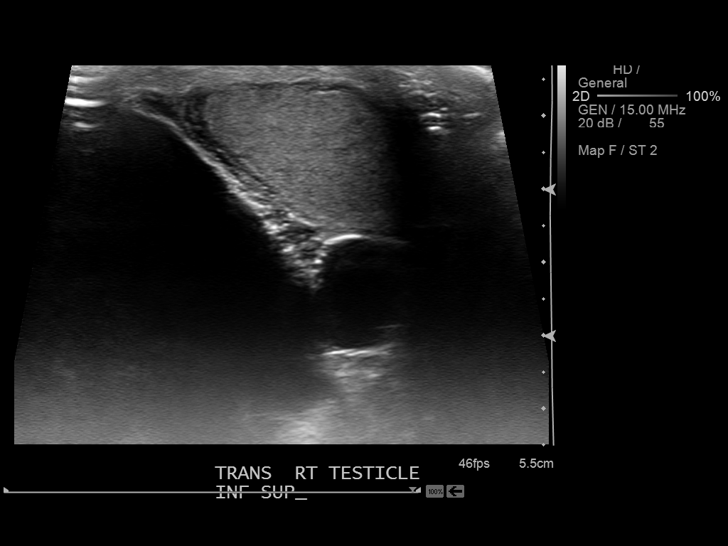
[im 18/70]
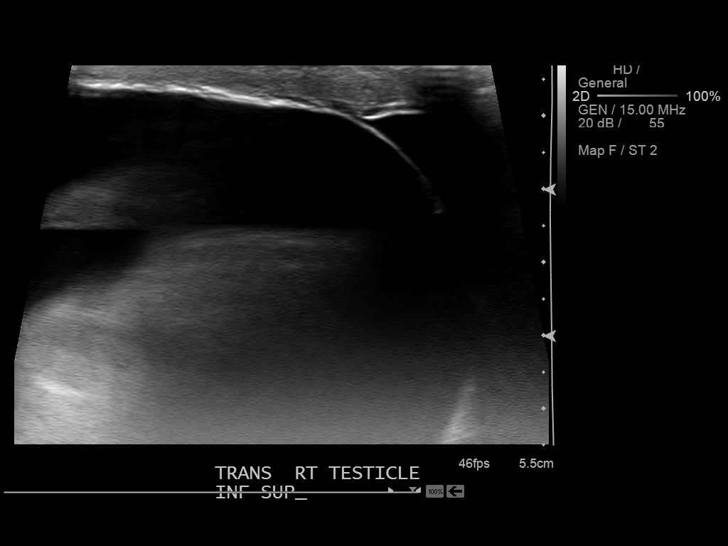
[im 24/70]
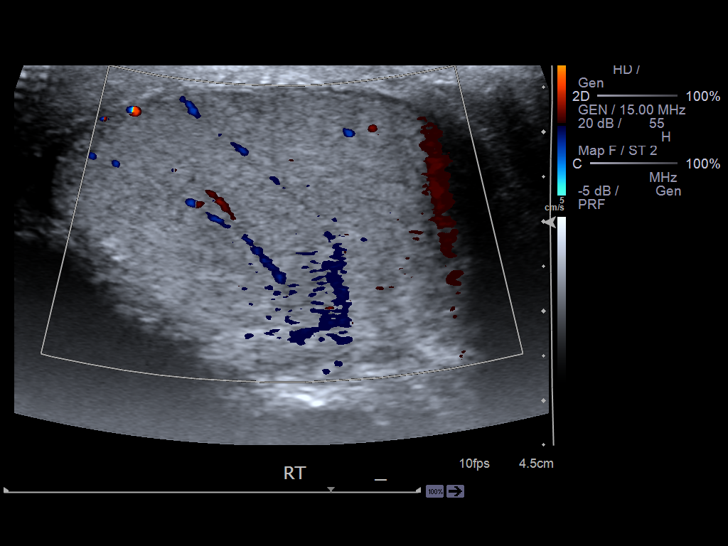
[im 29/70]
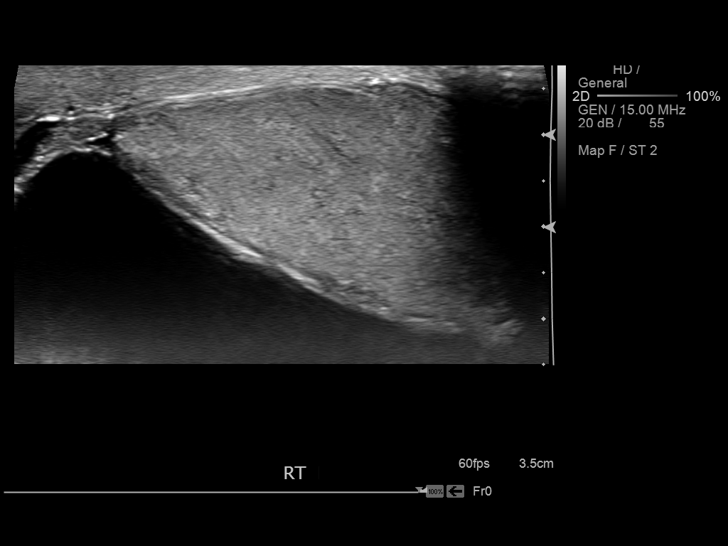
[im 35/70]
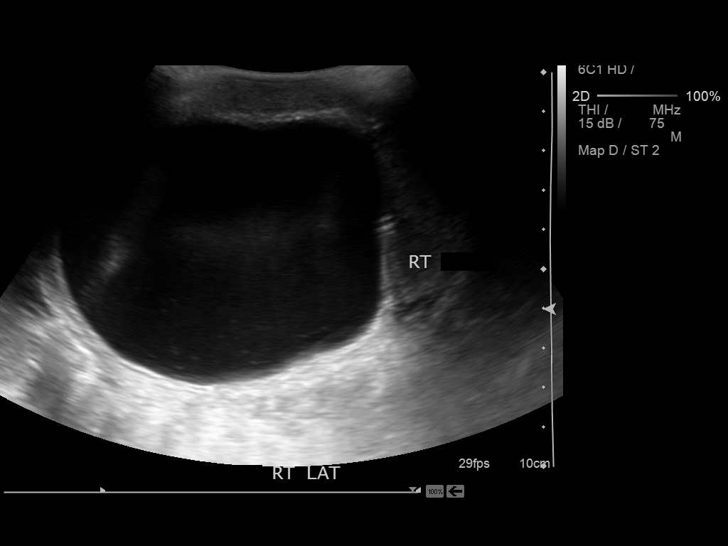
[im 41/70]
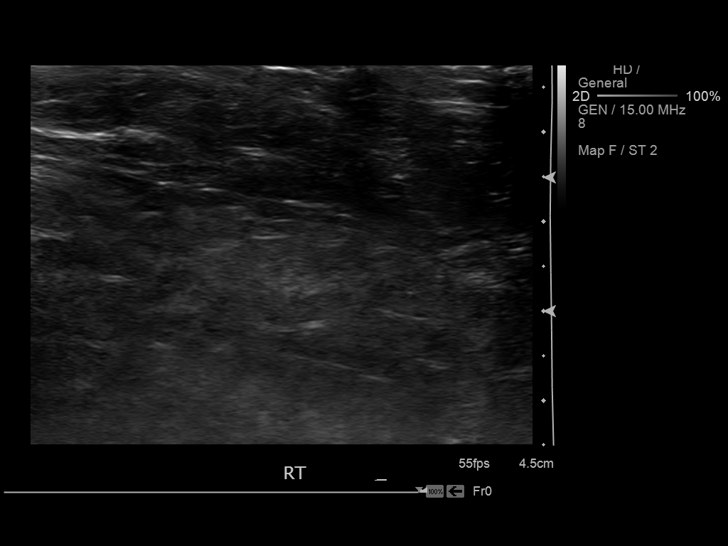
[im 47/70]
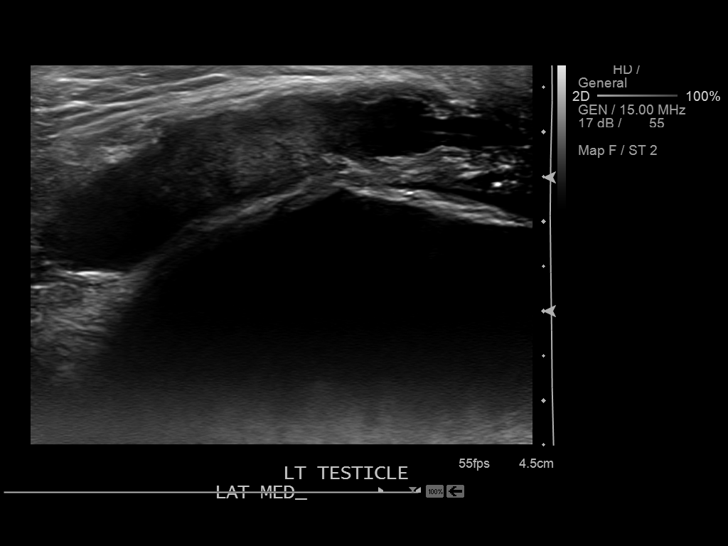
[im 52/70]
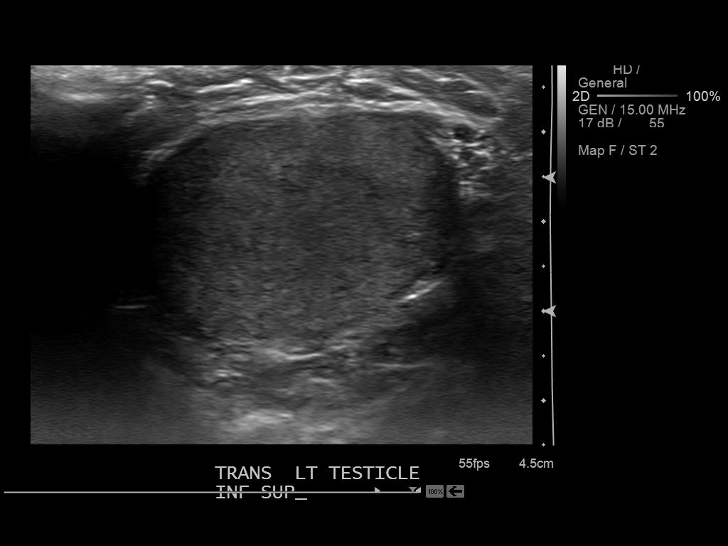
[im 58/70]
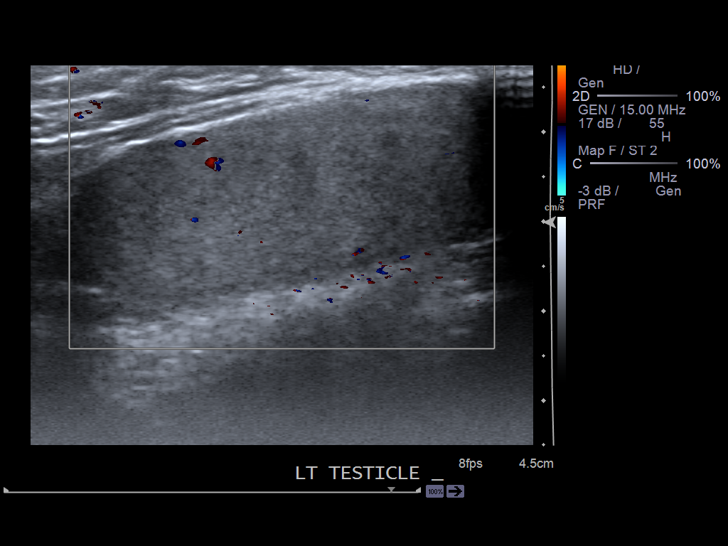
[im 64/70]
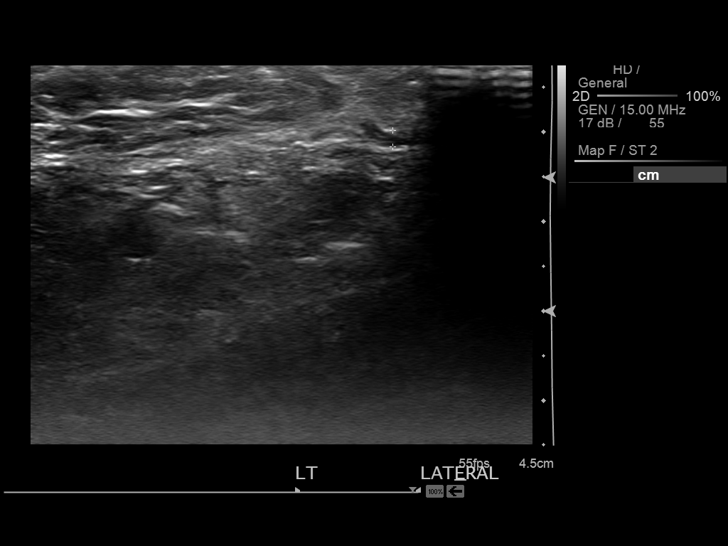
[im 70/70]
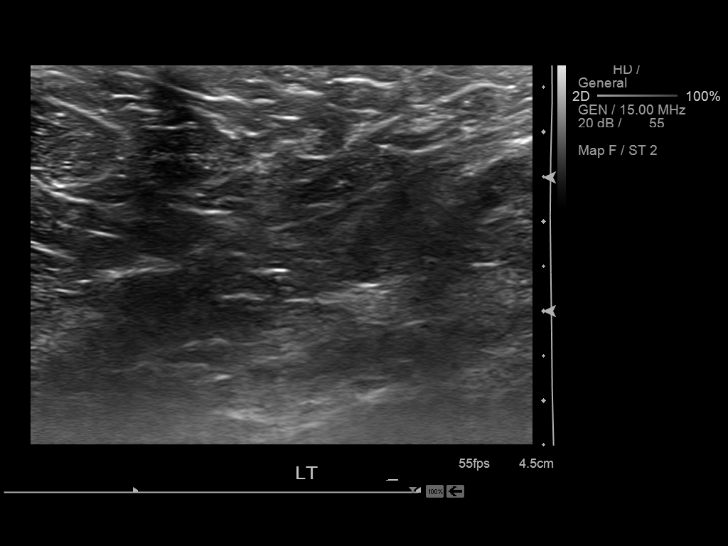

[13 of 25 positions shown; findings below may reference images not displayed]

FINDINGS: Right testicle

Measurements: 4.1 x 3.1 x 2.6 cm. No mass or microlithiasis
visualized.

Left testicle

Measurements: 5.3 x 3.2 x 2.4 cm. No mass or microlithiasis
visualized.

Right epididymis:  Normal in size and appearance.

Left epididymis:  Normal in size and appearance.

Hydrocele: There is a moderate-sized right hydrocele measuring 6.6 x
8.4 x 9.6 cm there is no hydrocele on the left.

Varicocele:  None visualized.

Pulsed Doppler interrogation of both testes demonstrates normal low
resistance arterial and venous waveforms bilaterally.
IMPRESSION: 1. Right-sided hydrocele. Within the limits of the comparison
studies it has not significantly changed in size.
2. The testes and epididymal structures are normal in echotexture
and vascularity.

## 2015-10-03 ENCOUNTER — Telehealth: Payer: Self-pay

## 2015-10-03 ENCOUNTER — Encounter: Payer: Self-pay | Admitting: Family Medicine

## 2015-10-03 ENCOUNTER — Ambulatory Visit (INDEPENDENT_AMBULATORY_CARE_PROVIDER_SITE_OTHER): Payer: Medicare Other | Admitting: Family Medicine

## 2015-10-03 VITALS — BP 122/68 | HR 87 | Temp 98.4°F | Resp 16 | Ht 69.0 in | Wt 209.9 lb

## 2015-10-03 DIAGNOSIS — M62569 Muscle wasting and atrophy, not elsewhere classified, unspecified lower leg: Secondary | ICD-10-CM | POA: Diagnosis not present

## 2015-10-03 DIAGNOSIS — M109 Gout, unspecified: Secondary | ICD-10-CM

## 2015-10-03 DIAGNOSIS — Z87891 Personal history of nicotine dependence: Secondary | ICD-10-CM

## 2015-10-03 DIAGNOSIS — Z8249 Family history of ischemic heart disease and other diseases of the circulatory system: Secondary | ICD-10-CM

## 2015-10-03 DIAGNOSIS — Z8612 Personal history of poliomyelitis: Secondary | ICD-10-CM | POA: Diagnosis not present

## 2015-10-03 DIAGNOSIS — R634 Abnormal weight loss: Secondary | ICD-10-CM | POA: Diagnosis not present

## 2015-10-03 DIAGNOSIS — E559 Vitamin D deficiency, unspecified: Secondary | ICD-10-CM

## 2015-10-03 DIAGNOSIS — Z136 Encounter for screening for cardiovascular disorders: Secondary | ICD-10-CM

## 2015-10-03 DIAGNOSIS — E785 Hyperlipidemia, unspecified: Secondary | ICD-10-CM

## 2015-10-03 DIAGNOSIS — G4733 Obstructive sleep apnea (adult) (pediatric): Secondary | ICD-10-CM

## 2015-10-03 DIAGNOSIS — Z23 Encounter for immunization: Secondary | ICD-10-CM | POA: Diagnosis not present

## 2015-10-03 DIAGNOSIS — I10 Essential (primary) hypertension: Secondary | ICD-10-CM

## 2015-10-03 DIAGNOSIS — R2681 Unsteadiness on feet: Secondary | ICD-10-CM

## 2015-10-03 DIAGNOSIS — M1000 Idiopathic gout, unspecified site: Secondary | ICD-10-CM

## 2015-10-03 DIAGNOSIS — Z0000 Encounter for general adult medical examination without abnormal findings: Secondary | ICD-10-CM | POA: Diagnosis not present

## 2015-10-03 DIAGNOSIS — R7309 Other abnormal glucose: Secondary | ICD-10-CM | POA: Diagnosis not present

## 2015-10-03 MED ORDER — ATORVASTATIN CALCIUM 40 MG PO TABS: 40.0000 mg | ORAL_TABLET | Freq: Every day | ORAL | 1 refills | Status: DC

## 2015-10-03 MED ORDER — BENAZEPRIL HCL 20 MG PO TABS: 20.0000 mg | ORAL_TABLET | Freq: Every day | ORAL | 1 refills | Status: DC

## 2015-10-03 NOTE — Telephone Encounter (Signed)
Called to schedule triple AAA screening on our hospital appointment line and the procedure would need a waiver form filled out due to patient having a CT of his abdomen and pelvis in April 2015. So this would not be classified as a screening due to having one when patient was 70 years of age and this scan was in the same category as the triple AAA. Patient was informed and refused scan due to cost, medical waiver was estimating the scan cost around 328.00.

## 2015-10-03 NOTE — Progress Notes (Signed)
Name: Shane Bowen   MRN: [id number]    DOB: [birth year]   Date:10/03/2015       Progress Note  Subjective  Chief Complaint  Chief Complaint  Patient presents with  . Annual Exam    HPI   Functional ability/safety issues: He has a history of Poliomyelitis , he usees a scooter to use prn when walks long distance  Hearing issues: Addressed   Activities of daily living: Discussed  Home safety issues: No Issues  End Of Life Planning: he has advanced directives, healthcare power of attorney.  Preventative care, Health maintenance, Preventative health measures discussed.  Preventative screenings discussed today: lab work, colonoscopy, PSA ( sees Insurance underwriter )   Men age 68 to 69 years if ever smoked recommended to get a one time AAA ultrasound screening exam. He wants to hold off on it at this time  Low Dose CT Chest recommended if Age 38-80 years, 30 pack-year currently smoking OR have quit w/in 15years. ( he quit in 1995 )  Lifestyle risk factor issued reviewed: Diet, exercise, weight management, advised patient smoking is not healthy, nutrition/diet. He has been losing a lot of weight since last year, but changing his life style  Preventative health measures discussed (5-10 year plan).  Reviewed and recommended vaccinations: - Pneumovax  - Prevnar  - Annual Influenza - Zostavax - Tdap   Depression screening: Done Fall risk screening: Done Discuss ADLs/IADLs: Done  Current medical providers: See HPI  Other health risk factors identified this visit: No other issues Cognitive impairment issues: None identified  All above discussed with patient. Appropriate education, counseling and referral will be made based upon the above.   OSA: he wears his CPAP every night He is due for repeat sleep study - we will try a home titration He recently went on vacation with his son and did not wear the CPAP and he did not snore and did not feel tired when he woke  up  HTN: bp is towards low end of normal, off Ziac since last 09, and now only on Lotrel, we will try going down to only ace inhibitor.   Dyslipidemia: lipid panel looked normal, but his ASCVD was 14% and he has been on statin therapy, denies side effects, he would like to try weaning self off since he has lost weight and he will do it before he returns next visit   Weight loss: he has lost 64 lbs in the past year, he states he started to lose weight after tooth abscess, and got motivated to stay on a diet after that ( started Dec 2017), he feels good. He states he has cut out carbohydrates, no desserts, smaller portions, and replaced his snacks with high protein snacks instead of chips, and he has been  drinking broth late at night instead of eating  Patient Active Problem List   Diagnosis Date Noted  . Special screening for malignant neoplasms, colon   . Other hydrocele 04/17/2015  . History of elevated PSA 04/17/2015  . Change in bowel movement 03/30/2015  . Elevated PSA 10/17/2014  . History of hematuria 10/17/2014  . Erectile dysfunction of organic origin 10/17/2014  . History of poliomyelitis 09/30/2014  . Gait instability 09/30/2014  . 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 Umbilical hernia without obstruction or gangrene 09/30/2014  . Kidney cysts 07/28/2014  . BPH (benign prostatic hyperplasia) 07/28/2014  . Insomnia, persistent 07/28/2014  . Dyslipidemia 07/28/2014  . Impotence of organic origin 07/28/2014  . Essential (primary)  hypertension 07/28/2014  . Blood glucose elevated 07/28/2014  . Male hypogonadism 07/28/2014  . Obesity (BMI 30-39.9) 07/28/2014  . Obstructive apnea 07/28/2014  . Spermatocele 07/28/2014  . Vitamin D deficiency 11/10/2008    Past Surgical History:  Procedure Laterality Date  . ANKLE FUSION Right 05/2009  . COLONOSCOPY    . COLONOSCOPY WITH PROPOFOL N/A 04/27/2015   Procedure: COLONOSCOPY WITH PROPOFOL;  Surgeon: Midge Miniumarren Wohl, MD;  [facility];  Service: Endoscopy;  Laterality: N/A;  CPAP  . INGUINAL HERNIA REPAIR    . LUMBAR LAMINECTOMY    . Right leg surgery     from polio age 89  . TENDON GRAFT Right 2009   Hand after Lacertion Injury     Family History  Problem Relation Age of Onset  . Hyperlipidemia Mother   . Heart disease Father   . Heart disease Sister     ATF  . Prostate cancer Neg Hx   . Kidney disease Neg Hx     Social History   Social History  . Marital status: Widowed    Spouse name: N/A  . Number of children: N/A  . Years of education: N/A   Occupational History  . Not on file.   Social History Main Topics  . Smoking status: Former Smoker    Packs/day: 1.50    Years: 25.00    Types: Cigarettes    Start date: 01/22/1968    Quit date: 01/21/1993  . Smokeless tobacco: Never Used  . Alcohol use 4.2 oz/week    7 Glasses of wine per week     Comment:    . Drug use: No  . Sexual activity: Not Currently   Other Topics Concern  . Not on file   Social History Narrative  . No narrative on file     Current Outpatient Prescriptions:  .  aspirin 81 MG tablet, Take 1 tablet by mouth daily., Disp: , Rfl:  .  atorvastatin (LIPITOR) 40 MG tablet, Take 1 tablet (40 mg total) by mouth daily., Disp: 90 tablet, Rfl: 1 .  saw palmetto 500 MG capsule, Take 500 mg by mouth daily., Disp: , Rfl:  .  benazepril (LOTENSIN) 20 MG tablet, Take 1 tablet (20 mg total) by mouth daily., Disp: 90 tablet, Rfl: 1  Allergies  Allergen Reactions  . Duloxetine Hcl     Doesn't remember reaction     ROS  Constitutional: Negative for fever, positive for  weight change.  Respiratory: Negative for cough and shortness of breath.   Cardiovascular: Negative for chest pain or palpitations.  Gastrointestinal: Negative for abdominal pain, no bowel changes.  Musculoskeletal: Positive  for gait problem, positive for 4-5 episodes  joint swelling, right big toe over the past couple of years, improves with ibuprofen.   Skin: Negative for rash.  Neurological: Negative for dizziness or headache.  No other specific complaints in a complete review of systems (except as listed in HPI above).  Objective  Vitals:   10/03/15 1109  BP: 122/68  Pulse: 87  Resp: 16  Temp: 98.4 F (36.9 C)  TempSrc: Oral  SpO2: 97%  Weight: 209 lb 14.4 oz (95.2 kg)  Height: 5\' 9"  (1.753 m)    Body mass index is 31 kg/m.  Physical Exam  Constitutional: Patient appears well-developed and well-nourished. No distress.  HENT: Head: Normocephalic and atraumatic. Ears: B TMs ok, no erythema or effusion; Nose: Nose normal. Mouth/Throat: Oropharynx is clear and moist. No oropharyngeal exudate.  Eyes: Conjunctivae and EOM are normal. Pupils are equal, round, and reactive to light. No scleral icterus.  Neck: Normal range of motion. Neck supple. No JVD present. No thyromegaly present.  Cardiovascular: Normal rate, regular rhythm and normal heart sounds.  No murmur heard. No BLE edema. Pulmonary/Chest: Effort normal and breath sounds normal. No respiratory distress. Abdominal: Soft. Bowel sounds are normal, no distension. There is no tenderness. large umbilical hernia MALE GENITALIA: sees urologist  RECTAL: sees Urologist  Musculoskeletal: Normal range of motion, no joint effusions. Atrophy or right leg Neurological: he is alert and oriented to person, place, and time. No cranial nerve deficit. Coordination, balance, strength, speech and gait are normal.  Skin: Skin is warm and dry. SK No erythema.  Psychiatric: Patient has a normal mood and affect. behavior is normal. Judgment and thought content normal.  PHQ2/9: Depression screen [facility] 2/9 10/03/2015 03/30/2015 09/30/2014 07/29/2014  Decreased Interest 0 0 0 0  Down, Depressed, Hopeless 0 0 0 0  PHQ - 2 Score 0 0 0 0     Fall Risk: Fall Risk  10/03/2015 03/30/2015 09/30/2014 07/29/2014  Falls in the past year? No No Yes Yes  Number falls in past yr: - - 1 1  Injury with Fall? - - No  No      Functional Status Survey: Is the patient deaf or have difficulty hearing?: No Does the patient have difficulty seeing, even when wearing glasses/contacts?: No Does the patient have difficulty concentrating, remembering, or making decisions?: No Does the patient have difficulty walking or climbing stairs?: Yes Does the patient have difficulty dressing or bathing?: No Does the patient have difficulty doing errands alone such as visiting a doctor's office or shopping?: Yes (Uses a electric wheelchair when walking long distances)    Assessment & Plan  1. Encounter for routine history and physical exam for male  Discussed importance of 150 minutes of physical activity weekly, eat two servings of fish weekly, eat one serving of tree nuts ( cashews, pistachios, pecans, almonds.Marland Kitchen) every other day, eat 6 servings of fruit/vegetables daily and drink plenty of water and avoid sweet beverages.   2. Needs flu shot  - Flu vaccine HIGH DOSE PF (Fluzone High dose)  3. Essential (primary) hypertension  - benazepril (LOTENSIN) 20 MG tablet; Take 1 tablet (20 mg total) by mouth daily.  Dispense: 90 tablet; Refill: 1 - CBC with Differential/Platelet - COMPLETE METABOLIC PANEL WITH GFR  4. Elevated glucose level  - Hemoglobin A1c  5. Dyslipidemia  - atorvastatin (LIPITOR) 40 MG tablet; Take 1 tablet (40 mg total) by mouth daily.  Dispense: 90 tablet; Refill: 1 - Lipid panel  6. Vitamin D deficiency  - VITAMIN D 25 Hydroxy (Vit-D Deficiency, Fractures)  7. History of poliomyelitis   8. Obstructive apnea  Needs titration study  9. Weight loss  - CBC with Differential/Platelet - COMPLETE METABOLIC PANEL WITH GFR - TSH - Vitamin B12 - Sedimentation rate  10. Podagra  - Uric acid  11. Gait instability   12. Muscle wasting and atrophy, not elsewhere classified, unspecified lower leg  - Ambulatory referral to Orthopedic Surgery  13. History of tobacco use  - US  ABDOMINAL AORTA SCREENING AAA; Future

## 2015-10-03 NOTE — Patient Instructions (Signed)
You may stop taking Atorvastatin 1-2 months prior to your next visit so we can recheck blood work and see if LDL is at goal without medication. Atorvastatin protects your heart so you can continue to take it if you would like to  We are changing your bp medication, please monitor at home and call us back if bp goes above 140/90  We will contact [employer] to arrange and home sleep titration study

## 2015-10-03 NOTE — Addendum Note (Signed)
Addended by: Cynda FamiliaJOHNSON, Georganna Maxson L on: 10/03/2015 02:48 PM   Modules accepted: Orders

## 2015-10-03 NOTE — Addendum Note (Signed)
Addended by: Cynda FamiliaJOHNSON, Chino Sardo L on: 10/03/2015 03:06 PM   Modules accepted: Orders

## 2015-10-04 ENCOUNTER — Other Ambulatory Visit: Payer: Self-pay | Admitting: Family Medicine

## 2015-10-04 DIAGNOSIS — E559 Vitamin D deficiency, unspecified: Secondary | ICD-10-CM | POA: Diagnosis not present

## 2015-10-04 DIAGNOSIS — R634 Abnormal weight loss: Secondary | ICD-10-CM | POA: Diagnosis not present

## 2015-10-04 DIAGNOSIS — E785 Hyperlipidemia, unspecified: Secondary | ICD-10-CM | POA: Diagnosis not present

## 2015-10-04 DIAGNOSIS — I10 Essential (primary) hypertension: Secondary | ICD-10-CM | POA: Diagnosis not present

## 2015-10-04 DIAGNOSIS — M1000 Idiopathic gout, unspecified site: Secondary | ICD-10-CM | POA: Diagnosis not present

## 2015-10-04 DIAGNOSIS — R7309 Other abnormal glucose: Secondary | ICD-10-CM | POA: Diagnosis not present

## 2015-10-04 LAB — VITAMIN B12: VITAMIN B 12: 297 pg/mL (ref 200–1100)

## 2015-10-04 LAB — COMPLETE METABOLIC PANEL WITH GFR
ALT: 21 U/L (ref 9–46)
AST: 21 U/L (ref 10–35)
Albumin: 4.2 g/dL (ref 3.6–5.1)
Alkaline Phosphatase: 72 U/L (ref 40–115)
BUN: 11 mg/dL (ref 7–25)
CHLORIDE: 105 mmol/L (ref 98–110)
CO2: 27 mmol/L (ref 20–31)
CREATININE: 0.72 mg/dL (ref 0.70–1.25)
Calcium: 9.7 mg/dL (ref 8.6–10.3)
GFR, Est Non African American: >89 mL/min (ref >=60)
Glucose, Bld: 76 mg/dL (ref 65–99)
POTASSIUM: 4.6 mmol/L (ref 3.5–5.3)
Sodium: 140 mmol/L (ref 135–146)
Total Bilirubin: 0.9 mg/dL (ref 0.2–1.2)
Total Protein: 6.7 g/dL (ref 6.1–8.1)

## 2015-10-04 LAB — LIPID PANEL
Cholesterol: 126 mg/dL (ref 125–200)
HDL: 70 mg/dL (ref >=40)
LDL CALC: 42 mg/dL (ref <130)
Total CHOL/HDL Ratio: 1.8 Ratio (ref <=5.0)
Triglycerides: 69 mg/dL (ref <150)
VLDL: 14 mg/dL (ref <30)

## 2015-10-04 LAB — CBC WITH DIFFERENTIAL/PLATELET
BASOS PCT: 0 %
Basophils Absolute: 0 cells/uL (ref 0–200)
EOS ABS: 275 {cells}/uL (ref 15–500)
Eosinophils Relative: 5 %
HEMATOCRIT: 46.2 % (ref 38.5–50.0)
HEMOGLOBIN: 15.6 g/dL (ref 13.2–17.1)
LYMPHS ABS: 1375 {cells}/uL (ref 850–3900)
LYMPHS PCT: 25 %
MCH: 29.1 pg (ref 27.0–33.0)
MCHC: 33.8 g/dL (ref 32.0–36.0)
MCV: 86 fL (ref 80.0–100.0)
MONO ABS: 330 {cells}/uL (ref 200–950)
MPV: 11.2 fL (ref 7.5–12.5)
Monocytes Relative: 6 %
NEUTROS ABS: 3520 {cells}/uL (ref 1500–7800)
Neutrophils Relative %: 64 %
Platelets: 162 10*3/uL (ref 140–400)
RBC: 5.37 MIL/uL (ref 4.20–5.80)
RDW: 14.4 % (ref 11.0–15.0)
WBC: 5.5 10*3/uL (ref 3.8–10.8)

## 2015-10-04 LAB — URIC ACID: URIC ACID, SERUM: 4.2 mg/dL (ref 4.0–8.0)

## 2015-10-04 LAB — TSH: TSH: 2.13 mIU/L (ref 0.40–4.50)

## 2015-10-05 LAB — VITAMIN D 25 HYDROXY (VIT D DEFICIENCY, FRACTURES): Vit D, 25-Hydroxy: 29 ng/mL — ABNORMAL LOW (ref 30–100)

## 2015-10-05 LAB — SEDIMENTATION RATE: Sed Rate: 4 mm/hr (ref 0–20)

## 2015-10-05 LAB — HEMOGLOBIN A1C
HEMOGLOBIN A1C: 5 % (ref <5.7)
Mean Plasma Glucose: 97 mg/dL

## 2015-10-06 DIAGNOSIS — M2170 Unequal limb length (acquired), unspecified site: Secondary | ICD-10-CM | POA: Diagnosis not present

## 2015-10-10 ENCOUNTER — Other Ambulatory Visit: Payer: Self-pay

## 2015-10-10 DIAGNOSIS — R972 Elevated prostate specific antigen [PSA]: Secondary | ICD-10-CM

## 2015-10-11 ENCOUNTER — Other Ambulatory Visit: Payer: Medicare Other

## 2015-10-11 DIAGNOSIS — R972 Elevated prostate specific antigen [PSA]: Secondary | ICD-10-CM

## 2015-10-12 LAB — PSA: Prostate Specific Ag, Serum: 4.2 ng/mL — ABNORMAL HIGH (ref 0.0–4.0)

## 2015-10-18 ENCOUNTER — Ambulatory Visit (INDEPENDENT_AMBULATORY_CARE_PROVIDER_SITE_OTHER): Payer: Medicare Other | Admitting: Urology

## 2015-10-18 ENCOUNTER — Encounter: Payer: Self-pay | Admitting: Urology

## 2015-10-18 VITALS — BP 122/77 | HR 90 | Ht 69.0 in | Wt 207.8 lb

## 2015-10-18 DIAGNOSIS — N433 Hydrocele, unspecified: Secondary | ICD-10-CM

## 2015-10-18 DIAGNOSIS — N401 Enlarged prostate with lower urinary tract symptoms: Secondary | ICD-10-CM | POA: Diagnosis not present

## 2015-10-18 DIAGNOSIS — Z87898 Personal history of other specified conditions: Secondary | ICD-10-CM | POA: Diagnosis not present

## 2015-10-18 DIAGNOSIS — N529 Male erectile dysfunction, unspecified: Secondary | ICD-10-CM

## 2015-10-18 DIAGNOSIS — Z87448 Personal history of other diseases of urinary system: Secondary | ICD-10-CM | POA: Diagnosis not present

## 2015-10-18 DIAGNOSIS — N528 Other male erectile dysfunction: Secondary | ICD-10-CM | POA: Diagnosis not present

## 2015-10-18 DIAGNOSIS — N138 Other obstructive and reflux uropathy: Secondary | ICD-10-CM

## 2015-10-18 NOTE — Progress Notes (Signed)
10/18/2015 9:11 AM   Shane Bowen 11-12-1945 [id number]  Referring provider: Alba CoryKrichna Sowles, MD [address] [place], [place] [id number]  Chief Complaint  Patient presents with  . Benign Prostatic Hypertrophy    Follow up    HPI: Patient is a 70 year old Caucasian male with a history of erectile dysfunction, BPH with LUTS, history of elevated PSA, history of gross hematuria and hydrocele who presents today for 6 month follow-up.  Erectile dysfunction  Patient is not sexually active at this time.  He has lost a lot of weight and will most likely start dating in the future.    BPH WITH LUTS His IPSS score today is 11, which is moderate lower urinary tract symptomatology. He is pleased with his quality life due to his urinary symptoms.  His previous IPSS 7/1.   He major complaint is morning time hesitancy, but then it resolves after his first morning void.  He denies any dysuria, hematuria or suprapubic pain.  He also denies any recent fevers, chills, nausea or vomiting.    He does not have a family history of PCa.      IPSS    Row Name 10/18/15 0800         International Prostate Symptom Score   How often have you had the sensation of not emptying your bladder? Less than 1 in 5     How often have you had to urinate less than every two hours? Less than half the time     How often have you found you stopped and started again several times when you urinated? Less than half the time     How often have you found it difficult to postpone urination? Less than half the time     How often have you had a weak urinary stream? Less than half the time     How often have you had to strain to start urination? Less than 1 in 5 times     How many times did you typically get up at night to urinate? 1 Time     Total IPSS Score 11       Quality of Life due to urinary symptoms   If you were to spend the rest of your life with your urinary condition just the way it is now how  would you feel about that? Mostly Satisfied        Score:  1-7 Mild 8-19 Moderate 20-35 Severe  Elevated PSA Patient had an elevated PSA of 4.5 ng/mL on 10/03/2011. His PSA's have returned below 4 since that time. His latest PSA was 4.2 ng/mL on 10/11/2015.    History of gross hematuria Patient underwent workup for gross hematuria in 04/2013 with CT urogram and cystoscopy. He was found to have bilateral renal cysts, an enlarged prostate and hydrocele. His UA on 04/17/2015 did not demonstrate any microscopic hematuria and he did not endorse any recent gross hematuria.  Right hydrocele Patient is losing weight and may want his hydrocele addressed in the future.  He is now desiring a surgical removal of his hydrocele.  It has become increasingly uncomfortable.     PMH: Past Medical History:  Diagnosis Date  . Abnormal ECG   . Androgen deficiency   . Bilateral renal cysts   . BPH with elevated PSA   . Chronic insomnia   . Epididymal cyst   . Erectile dysfunction   . Gross hematuria   . Hepatitis  age 64   . Hydrocele, right   . Hyperglycemia   . Hyperlipidemia   . Hypertension   . Incomplete bladder emptying   . Insomnia   . Lumbago   . Metabolic syndrome   . Obstructive sleep apnea    CPAP  . Over weight   . Post-polio syndrome   . Scrotal pain   . Vitamin D deficiency     Surgical History: Past Surgical History:  Procedure Laterality Date  . ANKLE FUSION Right 05/2009  . COLONOSCOPY    . COLONOSCOPY WITH PROPOFOL N/A 04/27/2015   Procedure: COLONOSCOPY WITH PROPOFOL;  Surgeon: Midge Minium, MD;  Location: [facility];  Service: Endoscopy;  Laterality: N/A;  CPAP  . INGUINAL HERNIA REPAIR    . LUMBAR LAMINECTOMY    . Right leg surgery     from polio age 35  . TENDON GRAFT Right 2009   Hand after Lacertion Injury     Home Medications:    Medication List       Accurate as of 10/18/15  9:11 AM. Always use your most recent med list.            aspirin 81 MG tablet Take 1 tablet by mouth daily.   atorvastatin 40 MG tablet Commonly known as:  LIPITOR Take 1 tablet (40 mg total) by mouth daily.   benazepril 20 MG tablet Commonly known as:  LOTENSIN Take 1 tablet (20 mg total) by mouth daily.   saw palmetto 500 MG capsule Take 500 mg by mouth daily.   vitamin B-12 1000 MCG tablet Commonly known as:  CYANOCOBALAMIN Take 1,000 mcg by mouth daily.       Allergies:  Allergies  Allergen Reactions  . Duloxetine Hcl     Doesn't remember reaction    Family History: Family History  Problem Relation Age of Onset  . Hyperlipidemia Mother   . Heart disease Father   . Heart disease Sister     ATF  . Prostate cancer Neg Hx   . Kidney disease Neg Hx     Social History:  reports that he quit smoking about 22 years ago. His smoking use included Cigarettes. He started smoking about 47 years ago. He has a 37.50 pack-year smoking history. He has never used smokeless tobacco. He reports that he drinks about 4.2 oz of alcohol per week . He reports that he does not use drugs.  ROS: UROLOGY Frequent Urination?: No Hard to postpone urination?: No Burning/pain with urination?: No Get up at night to urinate?: No Leakage of urine?: No Urine stream starts and stops?: Yes Trouble starting stream?: No Do you have to strain to urinate?: No Blood in urine?: No Urinary tract infection?: No Sexually transmitted disease?: No Injury to kidneys or bladder?: No Painful intercourse?: No Weak stream?: No Erection problems?: No Penile pain?: No  Gastrointestinal Nausea?: No Vomiting?: No Indigestion/heartburn?: No Diarrhea?: No Constipation?: No  Constitutional Fever: No Night sweats?: No Weight loss?: No Fatigue?: No  Skin Skin rash/lesions?: No Itching?: No  Eyes Blurred vision?: No Double vision?: No  Ears/Nose/Throat Sore throat?: No Sinus problems?: No  Hematologic/Lymphatic Swollen glands?: No Easy  bruising?: No  Cardiovascular Leg swelling?: No Chest pain?: No  Respiratory Cough?: No Shortness of breath?: No  Endocrine Excessive thirst?: No  Musculoskeletal Back pain?: No Joint pain?: No  Neurological Headaches?: No Dizziness?: No  Psychologic Depression?: No Anxiety?: No  Physical Exam: BP 122/77 (BP Location: Left Arm, Patient Position: Sitting, Cuff  Size: Normal)   Pulse 90   Ht 5\' 9"  (1.753 m)   Wt 207 lb 12.8 oz (94.3 kg)   BMI 30.69 kg/m   Constitutional: Well nourished. Alert and oriented, No acute distress. HEENT:  AT, moist mucus membranes. Trachea midline, no masses. Cardiovascular: No clubbing, cyanosis, or edema. Respiratory: Normal respiratory effort, no increased work of breathing. GI: Abdomen is soft, non tender, non distended, no abdominal masses. Liver and spleen not palpable.  No hernias appreciated.  Stool sample for occult testing is not indicated.   GU: No CVA tenderness.  No bladder fullness or masses.  Patient with circumcised phallus.   Urethral meatus is patent.  No penile discharge. No penile lesions or rashes. Scrotum without lesions, cysts, rashes and/or edema.  Large right hydrocele.   Rectal: Patient with  normal sphincter tone. Anus and perineum without scarring or rashes. No rectal masses are appreciated. Prostate is approximately 55 grams, no nodules are appreciated. Seminal vesicles are normal. Skin: No rashes, bruises or suspicious lesions. Lymph: No cervical or inguinal adenopathy. Neurologic: Grossly intact, no focal deficits, moving all 4 extremities. Psychiatric: Normal mood and affect.  Laboratory Data:  Lab Results  Component Value Date   CREATININE 0.72 10/04/2015   Lab Results  Component Value Date   HGBA1C 5.0 10/04/2015      Component Value Date/Time   CHOL 126 10/04/2015 0838   CHOL 130 09/30/2014 0949   HDL 70 10/04/2015 0838   HDL 53 09/30/2014 0949   CHOLHDL 1.8 10/04/2015 0838   VLDL 14  10/04/2015 0838   LDLCALC 42 10/04/2015 0838   LDLCALC 54 09/30/2014 0949   Lab Results  Component Value Date   AST 21 10/04/2015   Lab Results  Component Value Date   ALT 21 10/04/2015   PSA History  4.3 ng/mL on 10/17/2014  3.5 ng/mL on 11/10/2014  3.9 ng/mL on 04/11/2015  4.2 ng/mL on 10/12/2015    Assessment & Plan:    1. BPH (benign prostatic hyperplasia) with LUTS  - IPSS score is 11/2, it is worsening  - Continue conservative management, avoiding bladder irritants and timed voiding's  - RTC in 6 months for IPSS, PSA and exam   2. Erectile dysfunction:   Patient is not currently sexually active at this time.  He will like this to be addressed once his hydrocele is addressed.    3. Hydrocele:   Patient is interested in a surgical correction of his hydrocele at this time.   We will schedule a scrotal ultrasound and he will follow up with Dr. Apolinar Junes to discuss surgery.  4. History of hematuria: Patient underwent workup for gross hematuria in 04/2013 with CT urogram and cystoscopy. He was found to have bilateral renal cysts, an enlarged prostate and hydrocele. He UA did not demonstrate any microscopic hematuria in 03/2015 and he did not endorse any gross hematuria. He will be RTC in 6 months and we will recheck an UA.   5. History of elevated PSA: Patient had an elevated PSA of 4.5 ng/mL on 10/03/2011.  His latest PSA was 4.2 ng/mL on 10/12/2015. He would like to continue biannual monitoring. He will return in 6 months for PSA and DRE.   Return for scrotal ultrasound report with Dr. Apolinar Junes, he wants to dicuss surgery for the hydrocele.  These notes generated with voice recognition software. I apologize for typographical errors.  Michiel Cowboy, PA-C  [facility] [address], [address] [place], [place] [address] [phone]  [phone]   

## 2015-10-25 ENCOUNTER — Ambulatory Visit
Admission: RE | Admit: 2015-10-25 | Discharge: 2015-10-25 | Disposition: A | Discharge status: Home / Self Care | Payer: Medicare Other | Source: Ambulatory Visit | Attending: Urology | Admitting: Urology

## 2015-10-25 DIAGNOSIS — N5089 Other specified disorders of the male genital organs: Secondary | ICD-10-CM | POA: Diagnosis not present

## 2015-10-25 DIAGNOSIS — N433 Hydrocele, unspecified: Secondary | ICD-10-CM | POA: Diagnosis not present

## 2015-11-02 ENCOUNTER — Encounter: Payer: Self-pay | Admitting: Urology

## 2015-11-02 ENCOUNTER — Ambulatory Visit (INDEPENDENT_AMBULATORY_CARE_PROVIDER_SITE_OTHER): Payer: Medicare Other | Admitting: Urology

## 2015-11-02 ENCOUNTER — Ambulatory Visit: Payer: Medicare Other | Attending: Neurology

## 2015-11-02 VITALS — BP 118/76 | HR 79 | Ht 69.0 in | Wt 206.7 lb

## 2015-11-02 DIAGNOSIS — N433 Hydrocele, unspecified: Secondary | ICD-10-CM

## 2015-11-02 DIAGNOSIS — R310 Gross hematuria: Secondary | ICD-10-CM | POA: Diagnosis not present

## 2015-11-02 DIAGNOSIS — G4733 Obstructive sleep apnea (adult) (pediatric): Secondary | ICD-10-CM | POA: Diagnosis not present

## 2015-11-02 LAB — URINALYSIS, COMPLETE
BILIRUBIN UA: NEGATIVE
Glucose, UA: NEGATIVE
LEUKOCYTES UA: NEGATIVE
Nitrite, UA: NEGATIVE
PROTEIN UA: NEGATIVE
SPEC GRAV UA: 1.025 (ref 1.005–1.030)
Urobilinogen, Ur: 0.2 mg/dL (ref 0.2–1.0)
pH, UA: 5.5 (ref 5.0–7.5)

## 2015-11-02 LAB — MICROSCOPIC EXAMINATION: Epithelial Cells (non renal): NONE SEEN /hpf (ref 0–10)

## 2015-11-02 NOTE — Progress Notes (Signed)
11/02/2015 11:16 AM   Shane Looseaniel John Sirico 10/17/1945 [id number]  Referring provider: Alba CoryKrichna Sowles, MD [address] [place], [place] [address]  Chief Complaint  Patient presents with  . Hematuria    Gross patient has an appt. on October 19 with brandon to discuss surgery    HPI: Patient is a 70 year old Caucasian male with a history of erectile dysfunction, BPH with LUTS, history of elevated PSA, history of gross hematuria and hydrocele who presents today after an episode of gross hematuria.    History of gross hematuria Patient underwent workup for gross hematuria in 04/2013 with CT urogram and cystoscopy. He was found to have bilateral renal cysts, an enlarged prostate and hydrocele. Patient states that recently he had an episode where he urinated pure blood. He had associated dysuria with the passage of the blood. The dysuria reached a 2-3 pain level on a scale of 0-10.  He denied any injury to the kidneys, bladder, penis or urethra. He stated after the episode of gross hematuria the urine turned pink with subsequent voids and eventually cleared. His UA today is unremarkable.  He states he is still having a slight dysuria at this time.  Erectile dysfunction  Patient is not sexually active at this time.  He has lost a lot of weight and will most likely start dating in the future.    BPH WITH LUTS His IPSS score today is 11, which is moderate lower urinary tract symptomatology. He is pleased with his quality life due to his urinary symptoms.  His previous IPSS 7/1.   He major complaint is morning time hesitancy, but then it resolves after his first morning void.  He denies any dysuria, hematuria or suprapubic pain.  He also denies any recent fevers, chills, nausea or vomiting.    He does not have a family history of PCa.      IPSS    Row Name 10/18/15 0800         International Prostate Symptom Score   How often have you had the sensation of not emptying your  bladder? Less than 1 in 5     How often have you had to urinate less than every two hours? Less than half the time     How often have you found you stopped and started again several times when you urinated? Less than half the time     How often have you found it difficult to postpone urination? Less than half the time     How often have you had a weak urinary stream? Less than half the time     How often have you had to strain to start urination? Less than 1 in 5 times     How many times did you typically get up at night to urinate? 1 Time     Total IPSS Score 11       Quality of Life due to urinary symptoms   If you were to spend the rest of your life with your urinary condition just the way it is now how would you feel about that? Mostly Satisfied        Score:  1-7 Mild 8-19 Moderate 20-35 Severe  Elevated PSA Patient had an elevated PSA of 4.5 ng/mL on 10/03/2011. His PSA's have returned below 4 since that time. His latest PSA was 4.2 ng/mL on 10/11/2015.    Right hydrocele Patient recent scrotal ultrasound performed on 10/25/2015 noted an increase in the hydrocele  on the right from 6.6 x 8.4 x 9.6 cm on 04/2014 to 11.9 x 9.2 x 10.7 cm.  He has an upcoming appointment with Dr. Apolinar Junes to discuss possible hydrocelectomy.   PMH: Past Medical History:  Diagnosis Date  . Abnormal ECG   . Androgen deficiency   . Bilateral renal cysts   . BPH with elevated PSA   . Chronic insomnia   . Epididymal cyst   . Erectile dysfunction   . Gross hematuria   . Hepatitis    age 70   . Hydrocele, right   . Hyperglycemia   . Hyperlipidemia   . Hypertension   . Incomplete bladder emptying   . Insomnia   . Lumbago   . Metabolic syndrome   . Obstructive sleep apnea    CPAP  . Over weight   . Post-polio syndrome   . Scrotal pain   . Vitamin D deficiency     Surgical History: Past Surgical History:  Procedure Laterality Date  . ANKLE FUSION Right 05/2009  . COLONOSCOPY    .  COLONOSCOPY WITH PROPOFOL N/A 04/27/2015   Procedure: COLONOSCOPY WITH PROPOFOL;  Surgeon: Midge Minium, MD;  Location[facility];  Service: Endoscopy;  Laterality: N/A;  CPAP  . INGUINAL HERNIA REPAIR    . LUMBAR LAMINECTOMY    . Right leg surgery     from polio age 38  . TENDON GRAFT Right 2009   Hand after Lacertion Injury     Home Medications:    Medication List       Accurate as of 11/02/15 11:16 AM. Always use your most recent med list.          aspirin 81 MG tablet Take 1 tablet by mouth daily.   atorvastatin 40 MG tablet Commonly known as:  LIPITOR Take 1 tablet (40 mg total) by mouth daily.   benazepril 20 MG tablet Commonly known as:  LOTENSIN Take 1 tablet (20 mg total) by mouth daily.   saw palmetto 500 MG capsule Take 500 mg by mouth daily.   vitamin B-12 1000 MCG tablet Commonly known as:  CYANOCOBALAMIN Take 1,000 mcg by mouth daily.       Allergies:  Allergies  Allergen Reactions  . Duloxetine Hcl     Doesn't remember reaction    Family History: Family History  Problem Relation Age of Onset  . Hyperlipidemia Mother   . Heart disease Father   . Heart disease Sister     ATF  . Prostate cancer Neg Hx   . Kidney disease Neg Hx     Social History:  reports that he quit smoking about 22 years ago. His smoking use included Cigarettes. He started smoking about 47 years ago. He has a 37.50 pack-year smoking history. He has never used smokeless tobacco. He reports that he drinks about 4.2 oz of alcohol per week . He reports that he does not use drugs.  ROS: UROLOGY Frequent Urination?: No Hard to postpone urination?: No Burning/pain with urination?: Yes Get up at night to urinate?: No Leakage of urine?: No Urine stream starts and stops?: Yes Trouble starting stream?: No Do you have to strain to urinate?: No Blood in urine?: Yes Urinary tract infection?: No Sexually transmitted disease?: No Injury to kidneys or bladder?: No Painful  intercourse?: No Weak stream?: No Erection problems?: No Penile pain?: No  Gastrointestinal Nausea?: No Vomiting?: No Indigestion/heartburn?: No Diarrhea?: No Constipation?: No  Constitutional Fever: No Night sweats?: No Weight loss?:  No Fatigue?: No  Skin Skin rash/lesions?: No Itching?: No  Eyes Blurred vision?: No Double vision?: No  Ears/Nose/Throat Sore throat?: No Sinus problems?: No  Hematologic/Lymphatic Swollen glands?: No Easy bruising?: No  Cardiovascular Leg swelling?: No Chest pain?: No  Respiratory Cough?: No Shortness of breath?: No  Endocrine Excessive thirst?: No  Musculoskeletal Back pain?: No Joint pain?: No  Neurological Headaches?: No Dizziness?: No  Psychologic Depression?: No Anxiety?: No  Physical Exam: BP 118/76   Pulse 79   Ht 5\' 9"  (1.753 m)   Wt 206 lb 11.2 oz (93.8 kg)   BMI 30.52 kg/m   Constitutional: Well nourished. Alert and oriented, No acute distress. HEENT: [place] AT, moist mucus membranes. Trachea midline, no masses. Cardiovascular: No clubbing, cyanosis, or edema. Respiratory: Normal respiratory effort, no increased work of breathing. Skin: No rashes, bruises or suspicious lesions. Lymph: No cervical or inguinal adenopathy. Neurologic: Grossly intact, no focal deficits, moving all 4 extremities. Psychiatric: Normal mood and affect.  Laboratory Data:  Lab Results  Component Value Date   CREATININE 0.72 10/04/2015   Lab Results  Component Value Date   HGBA1C 5.0 10/04/2015      Component Value Date/Time   CHOL 126 10/04/2015 0838   CHOL 130 09/30/2014 0949   HDL 70 10/04/2015 0838   HDL 53 09/30/2014 0949   CHOLHDL 1.8 10/04/2015 0838   VLDL 14 10/04/2015 0838   LDLCALC 42 10/04/2015 0838   LDLCALC 54 09/30/2014 0949   Lab Results  Component Value Date   AST 21 10/04/2015   Lab Results  Component Value Date   ALT 21 10/04/2015   PSA History  4.3 ng/mL on 10/17/2014  3.5 ng/mL on  11/10/2014  3.9 ng/mL on 04/11/2015  4.2 ng/mL on 10/12/2015    Assessment & Plan:    1. Gross hematuria  - Patient underwent workup for gross hematuria in 04/2013 with CT urogram and cystoscopy. He was found to have bilateral renal cysts, an enlarged prostate and hydrocele.  - Patient will undergo a repeated hematuria work up with CT urogram and cystoscopy as it has been over 2 years and he is a former smoker  -  I explained to the patient that there are a number of causes that can be associated with blood in the urine, such as stones,  BPH, UTI's, damage to the urinary tract and/or cancer.  - I explained to the patient that a contrast material will be injected into a vein and that in rare instances, an allergic reaction can result and may even life threatening   The patient denies any allergies to contrast, iodine and/or seafood and is not taking metformin.  - Following the imaging study,  I've recommended a cystoscopy. I described how this is performed, typically in an office setting with a flexible cystoscope. We described the risks, benefits, and possible side effects, the most common of which is a minor amount of blood in the urine and/or burning which usually resolves in 24 to 48 hours.    - The patient had the opportunity to ask questions which were answered. Based upon this discussion, the patient is willing to proceed. Therefore, I've ordered: a CT Urogram and cystoscopy.  - He will return following all of the above for discussion of the results.     2. Hydrocele  - Recent scrotal ultrasound demonstrates enlargement of the hydrocele  - Patient is interested in a surgical correction of his hydrocele at this time and he has an  upcoming follow up with Dr. Apolinar Junes to discuss surgery.  - Treatment of the hydrocele may be delayed due to the hematuria work up and possible findings   3. BPH (benign prostatic hyperplasia) with LUTS  - Not addressed at this visit   4. Erectile  dysfunction  - Not addressed at this visit  5. History of elevated PSA  - Not addressed at this visit   Return for CT Urogram report and cystoscopy.  These notes generated with voice recognition software. I apologize for typographical errors.  Michiel Cowboy, PA-C  [facility] [address] [place], [place] [address] [phone]

## 2015-11-07 LAB — CULTURE, URINE COMPREHENSIVE

## 2015-11-09 ENCOUNTER — Encounter: Payer: Self-pay | Admitting: Urology

## 2015-11-09 ENCOUNTER — Ambulatory Visit (INDEPENDENT_AMBULATORY_CARE_PROVIDER_SITE_OTHER): Payer: Medicare Other | Admitting: Urology

## 2015-11-09 VITALS — BP 160/79 | HR 80 | Ht 69.0 in | Wt 208.0 lb

## 2015-11-09 DIAGNOSIS — N433 Hydrocele, unspecified: Secondary | ICD-10-CM | POA: Diagnosis not present

## 2015-11-09 NOTE — Progress Notes (Signed)
11/09/2015 3:12 PM   Shane Bowen [birth year] [id number]  Referring provider: Alba CoryKrichna Sowles, MD [address] [place], [place] [address]  Chief Complaint  Patient presents with  . Hydrocele    discuss options    HPI: 70 year old male with multiple GU issues including history of BPH with lots, elevated PSA, erectile dysfunction, and gross hematuria who presents today for surgical evaluation of his enlarging right hydrocele. Incidentally, he is scheduled next week for CT urogram and cystoscopy to complete his gross hematuria workup.  He notes that he's had an enlarging right hemiscrotum for approximately the past 4 years. This is not painful but it is bothersome given the size and he has difficulty finding sleep positions and pants to fit. He is ready for surgical intervention for this.  He denies any scrotal trauma or infections. He has been told in the past that the enlarging right hemiscrotum was secondary to a spermatocele rather than hydrocele.  Most recent scrotal imaging shows an 11.9 x 9.2 x 10.7 cm right hydrocele, otherwise no testicular or scrotal abnormalities.   PMH: Past Medical History:  Diagnosis Date  . Abnormal ECG   . Androgen deficiency   . Bilateral renal cysts   . BPH with elevated PSA   . Chronic insomnia   . Epididymal cyst   . Erectile dysfunction   . Gross hematuria   . Hepatitis    age 70   . Hydrocele, right   . Hyperglycemia   . Hyperlipidemia   . Hypertension   . Incomplete bladder emptying   . Insomnia   . Lumbago   . Metabolic syndrome   . Obstructive sleep apnea    CPAP  . Over weight   . Post-polio syndrome   . Scrotal pain   . Vitamin D deficiency     Surgical History: Past Surgical History:  Procedure Laterality Date  . ANKLE FUSION Right 05/2009  . COLONOSCOPY    . COLONOSCOPY WITH PROPOFOL N/A 04/27/2015   Procedure: COLONOSCOPY WITH PROPOFOL;  Surgeon: Midge Miniumarren Wohl, MD;  [facility];   Service: Endoscopy;  Laterality: N/A;  CPAP  . INGUINAL HERNIA REPAIR    . LUMBAR LAMINECTOMY    . Right leg surgery     from polio age 89  . TENDON GRAFT Right 2009   Hand after Lacertion Injury     Home Medications:    Medication List       Accurate as of 11/09/15  3:12 PM. Always use your most recent med list.          aspirin 81 MG tablet Take 1 tablet by mouth daily.   atorvastatin 40 MG tablet Commonly known as:  LIPITOR Take 1 tablet (40 mg total) by mouth daily.   benazepril 20 MG tablet Commonly known as:  LOTENSIN Take 1 tablet (20 mg total) by mouth daily.   saw palmetto 500 MG capsule Take 500 mg by mouth daily.   vitamin B-12 1000 MCG tablet Commonly known as:  CYANOCOBALAMIN Take 1,000 mcg by mouth daily.       Allergies:  Allergies  Allergen Reactions  . Duloxetine Hcl     Doesn't remember reaction    Family History: Family History  Problem Relation Age of Onset  . Hyperlipidemia Mother   . Heart disease Father   . Heart disease Sister     ATF  . Prostate cancer Neg Hx   . Kidney disease Neg Hx  Social History:  reports that he quit smoking about 22 years ago. His smoking use included Cigarettes. He started smoking about 47 years ago. He has a 37.50 pack-year smoking history. He has never used smokeless tobacco. He reports that he drinks about 4.2 oz of alcohol per week . He reports that he does not use drugs.  ROS: UROLOGY Frequent Urination?: Yes Hard to postpone urination?: No Burning/pain with urination?: No Get up at night to urinate?: No Leakage of urine?: No Urine stream starts and stops?: Yes Trouble starting stream?: No Do you have to strain to urinate?: No Blood in urine?: Yes Urinary tract infection?: No Sexually transmitted disease?: No Injury to kidneys or bladder?: No Painful intercourse?: No Weak stream?: No Erection problems?: No Penile pain?: No  Gastrointestinal Nausea?: No Vomiting?:  No Indigestion/heartburn?: No Diarrhea?: No Constipation?: No  Constitutional Fever: No Night sweats?: No Weight loss?: No Fatigue?: No  Skin Skin rash/lesions?: No Itching?: No  Eyes Blurred vision?: No Double vision?: No  Ears/Nose/Throat Sore throat?: No Sinus problems?: No  Hematologic/Lymphatic Swollen glands?: No Easy bruising?: No  Cardiovascular Leg swelling?: No Chest pain?: No  Respiratory Cough?: No Shortness of breath?: No  Endocrine Excessive thirst?: No  Musculoskeletal Back pain?: No Joint pain?: No  Neurological Headaches?: No Dizziness?: No  Psychologic Depression?: No Anxiety?: No  Physical Exam: BP (!) 160/79   Pulse 80   Ht 5\' 9"  (1.753 m)   Wt 208 lb (94.3 kg)   BMI 30.72 kg/m   Constitutional:  Alert and oriented, No acute distress. HEENT: [place] AT, moist mucus membranes.  Trachea midline, no masses. Cardiovascular: No clubbing, cyanosis, or edema. Respiratory: Normal respiratory effort, no increased work of breathing. GI: Abdomen is soft, nontender, nondistended, no abdominal masses GU: Circumcised buried phallus. Large honeydew melon sized right presumed hydrocele although do palpate soft tissue density on the posterior aspect which may possibly be the testicle and as such, this would represent a spermatocele. Left testicle somewhat displaced with mass effect from hydrocele, otherwise unremarkable, no masses, nontender. Skin: No rashes, bruises or suspicious lesions. Lymph: No cervical or inguinal adenopathy. Neurologic: Grossly intact, no focal deficits, moving all 4 extremities. Psychiatric: Normal mood and affect.  Laboratory Data: Lab Results  Component Value Date   WBC 5.5 10/04/2015   HGB 15.6 10/04/2015   HCT 46.2 10/04/2015   MCV 86.0 10/04/2015   PLT 162 10/04/2015    Lab Results  Component Value Date   CREATININE 0.72 10/04/2015    Lab Results  Component Value Date   PSA Normal 02/25/2014    Lab  Results  Component Value Date   HGBA1C 5.0 10/04/2015     Pertinent Imaging: CLINICAL DATA:  Hydrocele.  EXAM: SCROTAL ULTRASOUND  DOPPLER ULTRASOUND OF THE TESTICLES  TECHNIQUE: Complete ultrasound examination of the testicles, epididymis, and other scrotal structures was performed. Color and spectral Doppler ultrasound were also utilized to evaluate blood flow to the testicles.  COMPARISON:  04/22/2014.  FINDINGS: Right testicle  Measurements: 5.8 x 2.8 x 3.8 cm. No mass or microlithiasis visualized.  Left testicle  Measurements: 5.7 x 2.3 x 3.8 cm. No mass or microlithiasis visualized.  Right epididymis:  Normal in size and appearance.  Left epididymis:  Normal in size and appearance.  Hydrocele: 11.9 x 9.2 x 10.7 cm right hydrocele noted. This is increased in size from prior exam.  Varicocele:  None visualized.  Pulsed Doppler interrogation of both testes demonstrates normal low resistance arterial and venous  waveforms bilaterally.  IMPRESSION: 11.9 x 9.2 x 10.7 cm right hydrocele noted. This has increased in size from prior exam. No other focal abnormality identified.   Electronically Signed   By: Maisie Fus  Register   On: 10/26/2015 06:26  Scrotal ultrasound personally reviewed today.  Assessment & Plan:    1. Right hydrocele versus spermatocele Symptomatic right hydrocele although could possibly represent a spermatocele with a slightly effaced/displaced right testicle posteriorly.  I recommended surgical intervention for this. We discussed percutaneous drainage versus hydrocelectomy in the OR. Risks and benefits of each of the procedures were discussed in detail. We discussed the efficacy rates of each. Preoperative, intraoperative, and postoperative details were also discussed. Risks including bleeding, infection, pain, damage to surrounding structures, need for repeat procedures, recurrence, need for possible drain, and chronic  testicular pain were all discussed.  Also discussed that intraoperatively, if determined that this represents a spermatocele rather thanhydrocele, will proceed with spermatocelectomy versus epididymectomy which will be determined at the time of the procedure. He understands that epididymectomy may result in decreased fertility but will also help reduce the risk of spermatocele recurrence. All his questions were answered. As long as his hematuria workup is negative, he would like to proceed with surgery after the holidays.  He is on 81 mg of aspirin which will need to be held prior to surgery. He has no cardiac risk factors.   No other GU issues were addressed today.   Vanna Scotland, MD  [facility] [address], [address] [place], [place] [address] [phone]

## 2015-11-10 ENCOUNTER — Ambulatory Visit
Admission: RE | Admit: 2015-11-10 | Discharge: 2015-11-10 | Disposition: A | Discharge status: Home / Self Care | Payer: Medicare Other | Source: Ambulatory Visit | Attending: Urology | Admitting: Urology

## 2015-11-10 DIAGNOSIS — K760 Fatty (change of) liver, not elsewhere classified: Secondary | ICD-10-CM | POA: Diagnosis not present

## 2015-11-10 DIAGNOSIS — K429 Umbilical hernia without obstruction or gangrene: Secondary | ICD-10-CM | POA: Insufficient documentation

## 2015-11-10 DIAGNOSIS — N400 Enlarged prostate without lower urinary tract symptoms: Secondary | ICD-10-CM | POA: Insufficient documentation

## 2015-11-10 DIAGNOSIS — K4090 Unilateral inguinal hernia, without obstruction or gangrene, not specified as recurrent: Secondary | ICD-10-CM | POA: Insufficient documentation

## 2015-11-10 DIAGNOSIS — N281 Cyst of kidney, acquired: Secondary | ICD-10-CM | POA: Insufficient documentation

## 2015-11-10 DIAGNOSIS — N433 Hydrocele, unspecified: Secondary | ICD-10-CM | POA: Insufficient documentation

## 2015-11-10 DIAGNOSIS — R310 Gross hematuria: Secondary | ICD-10-CM | POA: Diagnosis not present

## 2015-11-10 MED ORDER — IOPAMIDOL (ISOVUE-370) INJECTION 76%
125.0000 mL | Freq: Once | INTRAVENOUS | Status: AC | PRN
  Administered 2015-11-10: 125 mL via INTRAVENOUS

## 2015-11-15 ENCOUNTER — Encounter: Payer: Self-pay | Admitting: Family Medicine

## 2015-11-15 ENCOUNTER — Ambulatory Visit (INDEPENDENT_AMBULATORY_CARE_PROVIDER_SITE_OTHER): Payer: Medicare Other | Admitting: Urology

## 2015-11-15 ENCOUNTER — Encounter: Payer: Self-pay | Admitting: Urology

## 2015-11-15 ENCOUNTER — Telehealth: Payer: Self-pay | Admitting: Radiology

## 2015-11-15 VITALS — BP 134/87 | HR 83 | Ht 69.0 in | Wt 209.2 lb

## 2015-11-15 DIAGNOSIS — R310 Gross hematuria: Secondary | ICD-10-CM | POA: Diagnosis not present

## 2015-11-15 LAB — URINALYSIS, COMPLETE
Bilirubin, UA: NEGATIVE
Glucose, UA: NEGATIVE
KETONES UA: NEGATIVE
Leukocytes, UA: NEGATIVE
Nitrite, UA: NEGATIVE
PH UA: 6.5 (ref 5.0–7.5)
Protein, UA: NEGATIVE
SPEC GRAV UA: 1.01 (ref 1.005–1.030)
Urobilinogen, Ur: 0.2 mg/dL (ref 0.2–1.0)

## 2015-11-15 LAB — MICROSCOPIC EXAMINATION: BACTERIA UA: NONE SEEN

## 2015-11-15 MED ORDER — LIDOCAINE HCL 2 % EX GEL
1.0000 "application " | Freq: Once | CUTANEOUS | Status: AC
  Administered 2015-11-15: 1 via URETHRAL

## 2015-11-15 MED ORDER — CIPROFLOXACIN HCL 500 MG PO TABS
500.0000 mg | ORAL_TABLET | Freq: Once | ORAL | Status: AC
  Administered 2015-11-15: 500 mg via ORAL

## 2015-11-15 NOTE — Telephone Encounter (Signed)
-----   Message from Vanna ScotlandAshley Brandon, MD sent at 11/09/2015  3:31 PM EDT ----- Regarding: schedule procedure Lashanti Chambless,  This guy will be calling you next week once his hematuria work up is complete next week.  He wants it done after christmas.  IF he doesn't call you, can you reach out to him/  Procedure: right hydrocelectomy, possible spermatocelectomy vs. epididymectomy   Ancef 2 g  No other labs/ orders.  Hold asa 81 mg.  No clearance needed.   Vanna ScotlandAshley Brandon, MD

## 2015-11-15 NOTE — Progress Notes (Signed)
.     11/15/2015 2:50 PM   Shane LooseDaniel John Bowen 1945-11-26 [id number]  Referring provider: Alba CoryKrichna Sowles, MD [address] [place], [place] [address]  Chief Complaint  Patient presents with  . Cysto    HPI: F/u gross hematuria for cystoscopy. Hydrocele planned. CT 11/10/2015 benign, prostate about 115 grams. PSA 4.2.   Cystoscopy Procedure Note  Patient identification was confirmed, informed consent was obtained, and patient was prepped using Betadine solution.  Lidocaine jelly was administered per urethral meatus.    Preoperative abx where received prior to procedure.     Pre-Procedure: - Inspection reveals a normal caliber ureteral meatus.  Procedure: The flexible cystoscope was introduced without difficulty - No urethral strictures/lesions are present. - Enlarged prostate. Lateral lobe hypertrophy  - Elevated bladder neck - Bilateral ureteral orifices difficult to identiy - Bladder mucosa  reveals no ulcers, tumors, or lesions - No bladder stones - No trabeculation  Retroflexion shows prostate protruding into bladder.    Post-Procedure: - Patient tolerated the procedure well  Assessment/ Plan:  Gross hematuria - benign eval  Hydrocele - surgery pending   BPH - discussed nature r/b/a to finasteride. He wants to consider.  PSA - discussed nature of elevated PSA. PSAD normal. Discussed importance of following PSA.

## 2015-11-16 ENCOUNTER — Other Ambulatory Visit: Payer: Self-pay | Admitting: Radiology

## 2015-11-16 DIAGNOSIS — N433 Hydrocele, unspecified: Secondary | ICD-10-CM

## 2015-11-16 NOTE — Telephone Encounter (Signed)
Notified pt of surgery scheduled [facility] with Dr Apolinar JunesBrandon on 01/29/16, pre-admit testing appt on 01/18/16 @9 :00, to call Friday prior to surgery for arrival time to SDS. Advised pt to hold ASA 81mg  7 days prior to surgery per Dr Apolinar JunesBrandon. Pt voices understanding.

## 2015-11-27 ENCOUNTER — Telehealth: Payer: Self-pay

## 2015-11-27 NOTE — Telephone Encounter (Signed)
Per the request of Dr. Carlynn PurlSowles, I contacted this patient to inform him that she has reviewed his BP log and does not feel that any changes need to be made to his medication at the moment. She stated that his sporadic elevated readings could have came from other things such as: cough, stress, pain, caffeine, decongestant, etc..  She strongly encourages him to continue to monitor his BP and to give us a call if he has 3 consecutive readings that are higher than 140/90.  Patient expressed verbal understanding and said thanks.

## 2015-12-05 ENCOUNTER — Other Ambulatory Visit: Payer: Self-pay

## 2015-12-05 ENCOUNTER — Encounter: Payer: Self-pay | Admitting: Urology

## 2015-12-05 ENCOUNTER — Encounter: Payer: Self-pay | Admitting: Family Medicine

## 2015-12-05 DIAGNOSIS — N401 Enlarged prostate with lower urinary tract symptoms: Secondary | ICD-10-CM

## 2015-12-05 MED ORDER — FINASTERIDE 5 MG PO TABS: 5.0000 mg | ORAL_TABLET | Freq: Every day | ORAL | 6 refills | Status: DC

## 2015-12-06 ENCOUNTER — Telehealth: Payer: Self-pay | Admitting: Family Medicine

## 2015-12-06 NOTE — Telephone Encounter (Signed)
Marcelino DusterMichelle from [facility] states that she has received the CPAP Titration however she is needing the PSG (the first sleep study that was done before October 12. (P) [phone] (F) [phone]

## 2015-12-07 NOTE — Telephone Encounter (Signed)
Please send it.

## 2015-12-07 NOTE — Telephone Encounter (Signed)
I tried to contact Shane Bowen to inform her that all the paperwork has been faxed several times and that the last time was 12/05/15. I read to her what it stated on page 6 and said if she had any questions to give us a call back.

## 2016-01-18 ENCOUNTER — Encounter
Admission: RE | Admit: 2016-01-18 | Discharge: 2016-01-18 | Disposition: A | Discharge status: Home / Self Care | Payer: Medicare Other | Source: Ambulatory Visit | Attending: Urology | Admitting: Urology

## 2016-01-18 DIAGNOSIS — Z01810 Encounter for preprocedural cardiovascular examination: Secondary | ICD-10-CM | POA: Diagnosis not present

## 2016-01-18 DIAGNOSIS — I10 Essential (primary) hypertension: Secondary | ICD-10-CM | POA: Diagnosis not present

## 2016-01-18 HISTORY — DX: Benign prostatic hyperplasia without lower urinary tract symptoms: N40.0

## 2016-01-18 HISTORY — DX: Anemia, unspecified: D64.9

## 2016-01-18 NOTE — Patient Instructions (Signed)
  Your procedure is scheduled on: 01/29/16 Mon Report to Same Day Surgery 2nd floor medical mall [facility](Medical Mall Entrance-take elevator on left to 2nd floor.  Check in with surgery information desk.) To find out your arrival time please call [phone] between 1PM - 3PM on 01/26/16 Fri  Remember: Instructions that are not followed completely may result in serious medical risk, up to and including death, or upon the discretion of your surgeon and anesthesiologist your surgery may need to be rescheduled.    _x___ 1. Do not eat food or drink liquids after midnight. No gum chewing or hard candies.     __x__ 2. No Alcohol for 24 hours before or after surgery.   __x__3. No Smoking for 24 prior to surgery.   ____  4. Bring all medications with you on the day of surgery if instructed.    __x__ 5. Notify your doctor if there is any change in your medical condition     (cold, fever, infections).     Do not wear jewelry, make-up, hairpins, clips or nail polish.  Do not wear lotions, powders, or perfumes. You may wear deodorant.  Do not shave 48 hours prior to surgery. Men may shave face and neck.  Do not bring valuables to the hospital.    [facility] is [facility] for any belongings or valuables.               Contacts, dentures or bridgework may not be worn into surgery.  Leave your suitcase in the car. After surgery it may be brought to your room.  For patients admitted to the hospital, discharge time is determined by your treatment team.   Patients discharged the day of surgery will not be allowed to drive home.  You will need someone to drive you home and stay with you the night of your procedure.    Please read over the following fact sheets that you were given:   [facility] Preparing for Surgery and or MRSA Information   _x___ Take these medicines the morning of surgery with A SIP OF WATER:    1. atorvastatin (LIPITOR  2.benazepril (LOTENSIN  3.  4.  5.  6.  ____Fleets enema  or Magnesium Citrate as directed.   _x___ Use CHG Soap or sage wipes as directed on instruction sheet   ____ Use inhalers on the day of surgery and bring to hospital day of surgery  ____ Stop metformin 2 days prior to surgery    ____ Take 1/2 of usual insulin dose the night before surgery and none on the morning of           surgery.   ____ Stop Aspirin, Coumadin, Pllavix ,Eliquis, Effient, or Pradaxa  x__ Stop Anti-inflammatories such as Advil, Aleve, Ibuprofen, Motrin, Naproxen,          Naprosyn, Goodies powders or aspirin products. Ok to take Tylenol.   __x__ Stop supplements until after surgery.  Stop Saw Palmetto 1 week before surgery  _x___ Bring C-Pap to the hospital.

## 2016-01-28 MED ORDER — CEFAZOLIN SODIUM-DEXTROSE 2-4 GM/100ML-% IV SOLN
2.0000 g | INTRAVENOUS | Status: AC
  Administered 2016-01-29: 2 g via INTRAVENOUS

## 2016-01-29 ENCOUNTER — Encounter: Payer: Self-pay | Admitting: *Deleted

## 2016-01-29 ENCOUNTER — Ambulatory Visit
Admission: RE | Admit: 2016-01-29 | Discharge: 2016-01-29 | Disposition: A | Discharge status: Home / Self Care | Payer: Medicare Other | Source: Ambulatory Visit | Attending: Urology | Admitting: Urology

## 2016-01-29 ENCOUNTER — Ambulatory Visit: Payer: Medicare Other | Admitting: Anesthesiology

## 2016-01-29 ENCOUNTER — Encounter
Admission: RE | Disposition: A | Discharge status: Home / Self Care | Payer: Self-pay | Source: Ambulatory Visit | Attending: Urology

## 2016-01-29 DIAGNOSIS — I10 Essential (primary) hypertension: Secondary | ICD-10-CM | POA: Insufficient documentation

## 2016-01-29 DIAGNOSIS — Z7982 Long term (current) use of aspirin: Secondary | ICD-10-CM | POA: Insufficient documentation

## 2016-01-29 DIAGNOSIS — N433 Hydrocele, unspecified: Secondary | ICD-10-CM | POA: Insufficient documentation

## 2016-01-29 DIAGNOSIS — G14 Postpolio syndrome: Secondary | ICD-10-CM | POA: Diagnosis not present

## 2016-01-29 DIAGNOSIS — Z6830 Body mass index (BMI) 30.0-30.9, adult: Secondary | ICD-10-CM | POA: Insufficient documentation

## 2016-01-29 DIAGNOSIS — N400 Enlarged prostate without lower urinary tract symptoms: Secondary | ICD-10-CM | POA: Diagnosis not present

## 2016-01-29 DIAGNOSIS — E663 Overweight: Secondary | ICD-10-CM | POA: Insufficient documentation

## 2016-01-29 DIAGNOSIS — E785 Hyperlipidemia, unspecified: Secondary | ICD-10-CM | POA: Diagnosis not present

## 2016-01-29 DIAGNOSIS — Z79899 Other long term (current) drug therapy: Secondary | ICD-10-CM | POA: Insufficient documentation

## 2016-01-29 DIAGNOSIS — Z87891 Personal history of nicotine dependence: Secondary | ICD-10-CM | POA: Insufficient documentation

## 2016-01-29 DIAGNOSIS — G4733 Obstructive sleep apnea (adult) (pediatric): Secondary | ICD-10-CM | POA: Insufficient documentation

## 2016-01-29 HISTORY — PX: HYDROCELE EXCISION: SHX482

## 2016-01-29 SURGERY — HYDROCELECTOMY
Anesthesia: General | Laterality: Right | Wound class: Clean Contaminated

## 2016-01-29 MED ORDER — FAMOTIDINE 20 MG PO TABS
20.0000 mg | ORAL_TABLET | Freq: Once | ORAL | Status: AC
  Administered 2016-01-29: 20 mg via ORAL

## 2016-01-29 MED ORDER — DOCUSATE SODIUM 100 MG PO CAPS: 100.0000 mg | ORAL_CAPSULE | Freq: Two times a day (BID) | ORAL | 0 refills | Status: DC

## 2016-01-29 MED ORDER — FAMOTIDINE 20 MG PO TABS
ORAL_TABLET | ORAL | Status: AC
  Filled 2016-01-29: qty 1

## 2016-01-29 MED ORDER — ACETAMINOPHEN 10 MG/ML IV SOLN
INTRAVENOUS | Status: AC
  Filled 2016-01-29: qty 100

## 2016-01-29 MED ORDER — CEFAZOLIN SODIUM-DEXTROSE 2-3 GM-% IV SOLR
INTRAVENOUS | Status: DC | PRN
  Administered 2016-01-29: 2 g via INTRAVENOUS

## 2016-01-29 MED ORDER — FENTANYL CITRATE (PF) 100 MCG/2ML IJ SOLN
25.0000 ug | INTRAMUSCULAR | Status: DC | PRN
  Administered 2016-01-29 (×3): 25 ug via INTRAVENOUS

## 2016-01-29 MED ORDER — PROPOFOL 10 MG/ML IV BOLUS
INTRAVENOUS | Status: AC
Start: 2016-01-29 — End: 2016-01-29
  Filled 2016-01-29: qty 20

## 2016-01-29 MED ORDER — CEFAZOLIN SODIUM-DEXTROSE 2-4 GM/100ML-% IV SOLN
INTRAVENOUS | Status: AC
  Filled 2016-01-29: qty 100

## 2016-01-29 MED ORDER — FENTANYL CITRATE (PF) 100 MCG/2ML IJ SOLN
INTRAMUSCULAR | Status: DC | PRN
  Administered 2016-01-29 (×2): 25 ug via INTRAVENOUS
  Administered 2016-01-29: 50 ug via INTRAVENOUS

## 2016-01-29 MED ORDER — OXYCODONE HCL 5 MG/5ML PO SOLN: 5.0000 mg | Freq: Once | ORAL | Status: AC | PRN

## 2016-01-29 MED ORDER — BUPIVACAINE HCL (PF) 0.5 % IJ SOLN
INTRAMUSCULAR | Status: AC
Start: 2016-01-29 — End: 2016-01-29
  Filled 2016-01-29: qty 30

## 2016-01-29 MED ORDER — LACTATED RINGERS IV SOLN
INTRAVENOUS | Status: DC
  Administered 2016-01-29 (×2): via INTRAVENOUS

## 2016-01-29 MED ORDER — OXYCODONE HCL 5 MG PO TABS
5.0000 mg | ORAL_TABLET | Freq: Once | ORAL | Status: AC | PRN
Start: 2016-01-29 — End: 2016-01-29
  Administered 2016-01-29: 5 mg via ORAL

## 2016-01-29 MED ORDER — OXYCODONE HCL 5 MG PO TABS
ORAL_TABLET | ORAL | Status: AC
  Filled 2016-01-29: qty 1

## 2016-01-29 MED ORDER — PROPOFOL 10 MG/ML IV BOLUS
INTRAVENOUS | Status: DC | PRN
Start: 2016-01-29 — End: 2016-01-29
  Administered 2016-01-29: 150 mg via INTRAVENOUS
  Administered 2016-01-29: 30 mg via INTRAVENOUS

## 2016-01-29 MED ORDER — PHENYLEPHRINE HCL 10 MG/ML IJ SOLN
INTRAMUSCULAR | Status: DC | PRN
  Administered 2016-01-29 (×2): 100 ug via INTRAVENOUS

## 2016-01-29 MED ORDER — ATROPINE SULFATE 0.4 MG/ML IV SOSY
PREFILLED_SYRINGE | INTRAVENOUS | Status: AC
  Filled 2016-01-29: qty 2.5

## 2016-01-29 MED ORDER — MIDAZOLAM HCL 2 MG/2ML IJ SOLN
INTRAMUSCULAR | Status: DC | PRN
  Administered 2016-01-29: 2 mg via INTRAVENOUS

## 2016-01-29 MED ORDER — HYDROCODONE-ACETAMINOPHEN 5-325 MG PO TABS: 1.0000 | ORAL_TABLET | Freq: Four times a day (QID) | ORAL | 0 refills | Status: DC | PRN

## 2016-01-29 MED ORDER — BUPIVACAINE HCL 0.5 % IJ SOLN
INTRAMUSCULAR | Status: DC | PRN
  Administered 2016-01-29: 15 mL

## 2016-01-29 MED ORDER — ACETAMINOPHEN 10 MG/ML IV SOLN
INTRAVENOUS | Status: DC | PRN
  Administered 2016-01-29: 1000 mg via INTRAVENOUS

## 2016-01-29 MED ORDER — MIDAZOLAM HCL 2 MG/2ML IJ SOLN
INTRAMUSCULAR | Status: AC
  Filled 2016-01-29: qty 2

## 2016-01-29 MED ORDER — FENTANYL CITRATE (PF) 100 MCG/2ML IJ SOLN
INTRAMUSCULAR | Status: AC
  Filled 2016-01-29: qty 2

## 2016-01-29 MED ORDER — LIDOCAINE HCL (CARDIAC) 20 MG/ML IV SOLN
INTRAVENOUS | Status: DC | PRN
  Administered 2016-01-29: 40 mg via INTRAVENOUS

## 2016-01-29 MED ORDER — LIDOCAINE 2% (20 MG/ML) 5 ML SYRINGE
INTRAMUSCULAR | Status: AC
  Filled 2016-01-29: qty 5

## 2016-01-29 MED ORDER — PHENYLEPHRINE HCL 10 MG/ML IJ SOLN
INTRAMUSCULAR | Status: AC
  Filled 2016-01-29: qty 1

## 2016-01-29 MED ORDER — KETOROLAC TROMETHAMINE 30 MG/ML IJ SOLN
INTRAMUSCULAR | Status: AC
  Filled 2016-01-29: qty 1

## 2016-01-29 MED ORDER — LIDOCAINE HCL 2 % EX GEL
CUTANEOUS | Status: AC
  Filled 2016-01-29: qty 5

## 2016-01-29 MED ORDER — LIDOCAINE HCL (PF) 1 % IJ SOLN
INTRAMUSCULAR | Status: AC
  Filled 2016-01-29: qty 30

## 2016-01-29 MED ORDER — KETOROLAC TROMETHAMINE 30 MG/ML IJ SOLN
INTRAMUSCULAR | Status: DC | PRN
  Administered 2016-01-29: 30 mg via INTRAVENOUS

## 2016-01-29 SURGICAL SUPPLY — 37 items
BLADE SURG 15 STRL LF DISP TIS (BLADE) ×2 IMPLANT
BLADE SURG 15 STRL SS (BLADE) ×1
CANISTER SUCT 1200ML W/VALVE (MISCELLANEOUS) ×3 IMPLANT
CHLORAPREP W/TINT 26ML (MISCELLANEOUS) ×6 IMPLANT
DRAIN PENROSE 1/4X12 LTX (DRAIN) IMPLANT
DRAIN PENROSE 5/8X18 LTX STRL (WOUND CARE) IMPLANT
DRAPE LAPAROTOMY 77X122 PED (DRAPES) ×3 IMPLANT
ELECT REM PT RETURN 9FT ADLT (ELECTROSURGICAL) ×3
ELECTRODE REM PT RTRN 9FT ADLT (ELECTROSURGICAL) ×2 IMPLANT
GAUZE FLUFF 18X24 1PLY STRL (GAUZE/BANDAGES/DRESSINGS) ×3 IMPLANT
GAUZE SPONGE 4X4 12PLY STRL (GAUZE/BANDAGES/DRESSINGS) ×3 IMPLANT
GLOVE BIO SURGEON STRL SZ 6.5 (GLOVE) ×12 IMPLANT
GLOVE INDICATOR 7.0 STRL GRN (GLOVE) ×3 IMPLANT
GOWN STRL REUS W/ TWL LRG LVL3 (GOWN DISPOSABLE) ×4 IMPLANT
GOWN STRL REUS W/TWL LRG LVL3 (GOWN DISPOSABLE) ×2
KIT RM TURNOVER STRD PROC AR (KITS) ×3 IMPLANT
LABEL OR SOLS (LABEL) IMPLANT
LIQUID BAND (GAUZE/BANDAGES/DRESSINGS) ×3 IMPLANT
NEEDLE HYPO 25X1 1.5 SAFETY (NEEDLE) ×3 IMPLANT
NS IRRIG 500ML POUR BTL (IV SOLUTION) ×3 IMPLANT
PACK BASIN MINOR ARMC (MISCELLANEOUS) ×3 IMPLANT
SOL PREP PVP 2OZ (MISCELLANEOUS)
SOLUTION PREP PVP 2OZ (MISCELLANEOUS) IMPLANT
STRIP CLOSURE SKIN 1/2X4 (GAUZE/BANDAGES/DRESSINGS) IMPLANT
SUPPORETR ATHLETIC LG (MISCELLANEOUS) ×2 IMPLANT
SUPPORT SCROTAL LG STRP (MISCELLANEOUS) IMPLANT
SUPPORTER ATHLETIC LG (MISCELLANEOUS) ×3
SUT CHROMIC 3 0 SH 27 (SUTURE) ×3 IMPLANT
SUT ETHILON 3-0 FS-10 30 BLK (SUTURE)
SUT ETHILON NAB PS2 4-0 18IN (SUTURE) IMPLANT
SUT VIC AB 3-0 SH 27 (SUTURE) ×2
SUT VIC AB 3-0 SH 27X BRD (SUTURE) ×4 IMPLANT
SUT VIC AB 4-0 PS2 18 (SUTURE) IMPLANT
SUT VIC AB 4-0 SH 27 (SUTURE)
SUT VIC AB 4-0 SH 27XANBCTRL (SUTURE) IMPLANT
SUTURE EHLN 3-0 FS-10 30 BLK (SUTURE) IMPLANT
SYRINGE 10CC LL (SYRINGE) ×3 IMPLANT

## 2016-01-29 NOTE — Anesthesia Procedure Notes (Signed)
Procedure Name: LMA Insertion Date/Time: 01/29/2016 7:45 AM Performed by: Henrietta HooverPOPE, Dakhari Zuver Pre-anesthesia Checklist: Patient identified, Emergency Drugs available, Suction available, Patient being monitored and Timeout performed Patient Re-evaluated:Patient Re-evaluated prior to inductionOxygen Delivery Method: Circle system utilized Preoxygenation: Pre-oxygenation with 100% oxygen Intubation Type: IV induction Ventilation: Mask ventilation with difficulty LMA: LMA inserted LMA Size: 5.0 Number of attempts: 1 Placement Confirmation: positive ETCO2 Tube secured with: Tape Dental Injury: Teeth and Oropharynx as per pre-operative assessment

## 2016-01-29 NOTE — Op Note (Signed)
Date of procedure: 01/29/16  Preoperative diagnosis:  1. Right hydrocele   Postoperative diagnosis:  1. Right hydrocele   Procedure: 1. Right hydrocelectomy  Surgeon: Vanna ScotlandAshley Zamiyah Resendes, MD  Anesthesia: General  Complications: None  Intraoperative findings: 650 cc of clear fluid drained from right hydrocele sac  EBL: Minimal  Specimens: None, hydrocele sac not sent for pathology  Drains: None  Indication: Shane InchesDaniel J Bowen is a 71 y.o. patient with large symptomatic right hydrocele.  After reviewing the management options for treatment, he elected to proceed with the above surgical procedure(s). We have discussed the potential benefits and risks of the procedure, side effects of the proposed treatment, the likelihood of the patient achieving the goals of the procedure, and any potential problems that might occur during the procedure or recuperation. Informed consent has been obtained.  Description of procedure:  The patient was taken to the operating room and general anesthesia was induced.  The patient was placed in the supine position, prepped the chest was then closed in a running layer using in the usual sterile fashion, and preoperative antibiotics were administered. A preoperative time-out was performed.   A total of 15 cc of half percent Marcaine was used as local anesthesia over the planned incision site. Approximately 8 cm long incision was made over mid right hemiscrotum transversely. The incision was carried down to the subcutaneous and her toes tissues using Bovie electrocautery. The hydrocele sac was then identified and bluntly dissected. The right testicle with hydrocele was then delivered through the incision. The hydrocele was then incised in 650 cc of clear fluid was drained from the blind ending sac. The edges of the sac were then excised using Bovie electrocautery and care taken to avoid any injury to the cord and epididymis. The edges were then everted and  reapproximated loosely using Vicryl with care taken to avoid strangulation of the cord. A second layer of sac was identified and also excised with care taken to achieve meticulous hemostasis. The base of the wound was then carefully inspected and careful hemostasis was achieved. The wound was copiously irrigated. The testicle was returned back into the right hemiscrotum in the correct anatomic position/orientation. The wound was then closed in a running fashion using 3-0 Vicryl to close that are toes and interrupted 3-0 chromic to close the scrotal skin. The wound was cleaned and dried. Dermabond was applied. Scrotal fluffs and a scrotal support was were also applied. He was reversed from anesthesia, and taken to the PACU in stable condition.  Plan: Patient will follow-up in 4 weeks for wound check. Post op wound care was discussed the preoperative holding area extensively with the patient and his son.  [name], M.D.

## 2016-01-29 NOTE — Anesthesia Preprocedure Evaluation (Signed)
Anesthesia Evaluation  Patient identified by MRN, date of birth, ID band Patient awake    Reviewed: Allergy & Precautions, H&P , NPO status , Patient's Chart, lab work & pertinent test results  History of Anesthesia Complications Negative for: history of anesthetic complications  Airway Mallampati: III  TM Distance: >3 FB Neck ROM: full    Dental  (+) Poor Dentition, Chipped, Caps   Pulmonary neg shortness of breath, sleep apnea and Continuous Positive Airway Pressure Ventilation , former smoker,    Pulmonary exam normal breath sounds clear to auscultation       Cardiovascular Exercise Tolerance: Good hypertension, (-) angina(-) Past MI and (-) DOE Normal cardiovascular exam Rhythm:regular Rate:Normal     Neuro/Psych negative neurological ROS  negative psych ROS   GI/Hepatic negative GI ROS, neg GERD  ,(+) Hepatitis -  Endo/Other  negative endocrine ROS  Renal/GU Renal disease     Musculoskeletal   Abdominal   Peds  Hematology negative hematology ROS (+)   Anesthesia Other Findings Past Medical History: No date: Abnormal ECG No date: Androgen deficiency No date: Anemia No date: Bilateral renal cysts No date: BPH with elevated PSA No date: Chronic insomnia No date: Epididymal cyst No date: Erectile dysfunction No date: Gross hematuria No date: Hepatitis     Comment: age 71 hepatitis A No date: Hydrocele, right No date: Hyperglycemia No date: Hyperlipidemia No date: Hypertension No date: Incomplete bladder emptying No date: Insomnia No date: Lumbago No date: Metabolic syndrome No date: Obstructive sleep apnea     Comment: CPAP No date: Over weight 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 No date: Prostate enlargement No date: Scrotal pain No date: Vitamin D deficiency  Past Surgical History: 05/2009: ANKLE FUSION Right No date: COLONOSCOPY 04/27/2015: COLONOSCOPY WITH PROPOFOL N/A     Comment: Procedure:  COLONOSCOPY WITH PROPOFOL;                Surgeon: Midge Miniumarren Wohl, MD;  [facility];  Service: Endoscopy;  Laterality:              N/A;  CPAP No date: INGUINAL HERNIA REPAIR No date: LUMBAR LAMINECTOMY No date: Right leg surgery     Comment: from polio age 89 2009: TENDON GRAFT Right     Comment: Hand after Lacertion Injury   BMI    Body Mass Index:  30.72 kg/m      Reproductive/Obstetrics negative OB ROS                             Anesthesia Physical Anesthesia Plan  ASA: III  Anesthesia Plan: General LMA   Post-op Pain Management:    Induction:   Airway Management Planned:   Additional Equipment:   Intra-op Plan:   Post-operative Plan:   Informed Consent: I have reviewed the patients History and Physical, chart, labs and discussed the procedure including the risks, benefits and alternatives for the proposed anesthesia with the patient or authorized representative who has indicated his/her understanding and acceptance.   Dental Advisory Given  Plan Discussed with: Anesthesiologist, CRNA and Surgeon  Anesthesia Plan Comments:         Anesthesia Quick Evaluation

## 2016-01-29 NOTE — Anesthesia Postprocedure Evaluation (Signed)
Anesthesia Post Note  Patient: Shane RossettiDaniel J Bowen  Procedure(s) Performed: Procedure(s) (LRB): HYDROCELECTOMY ADULT (Right)  Patient location during evaluation: PACU Anesthesia Type: General Level of consciousness: awake and alert Pain management: pain level controlled Vital Signs Assessment: post-procedure vital signs reviewed and stable Respiratory status: spontaneous breathing, nonlabored ventilation, respiratory function stable and patient connected to nasal cannula oxygen Cardiovascular status: blood pressure returned to baseline and stable Postop Assessment: no signs of nausea or vomiting Anesthetic complications: no     Last Vitals:  Vitals:   01/29/16 0952 01/29/16 1012  BP: (!) 164/86 (!) 154/81  Pulse: 77 75  Resp: 16 16  Temp: 36.3 C     Last Pain:  Vitals:   01/29/16 1033  TempSrc:   PainSc: 2                  Cleda MccreedyJoseph K Piscitello

## 2016-01-29 NOTE — Transfer of Care (Signed)
Immediate Anesthesia Transfer of Care Note  Patient: Shane RossettiDaniel J Stelzner  Procedure(s) Performed: Procedure(s): HYDROCELECTOMY ADULT (Right)  Patient Location: PACU  Anesthesia Type:General  Level of Consciousness: sedated  Airway & Oxygen Therapy: Patient Spontanous Breathing and Patient connected to face mask oxygen  Post-op Assessment: Report given to RN and Post -op Vital signs reviewed and stable  Post vital signs: Reviewed and stable  Last Vitals:  Vitals:   01/29/16 0720 01/29/16 0904  BP: (!) 144/91 (P) 103/67  Pulse: 70   Resp: 16   Temp:  36.1 C    Last Pain:  Vitals:   01/29/16 0605  TempSrc: Tympanic         Complications: No apparent anesthesia complications

## 2016-01-29 NOTE — Discharge Instructions (Signed)
Hydrocelectomy, Adult, Care After This sheet gives you information about how to care for yourself after your procedure. Your health care provider may also give you more specific instructions. If you have problems or questions, contact your health care provider. What can I expect after the procedure? After your procedure, it is common to have mild discomfort, swelling, and bruising in the pouch that holds your testicles (scrotum). Follow these instructions at home: Bathing  Ask your health care provider when you can shower, take baths, or go swimming.  If you were told to wear an athletic support strap, take it off when you shower or take a bath. Incision care  Follow instructions from your health care provider about how to take care of your incision. Make sure you:  Wash your hands with soap and water before you change your bandage (dressing). If soap and water are not available, use hand sanitizer.  Change your dressing as told by your health care provider.  Leave stitches (sutures) in place.  Check your incision and scrotum every day for signs of infection. Check for:  More redness, swelling, or pain.  Blood or fluid.  Warmth.  Pus or a bad smell. Managing pain, stiffness, and swelling  If directed, apply ice to the injured area:  Put ice in a plastic bag.  Place a towel between your skin and the bag.  Leave the ice on for 20 minutes, 2-3 times per day. Driving  Do not drive for 24 hours if you were given a sedative.  Do not drive or use heavy machinery while taking prescription pain medicine.  Ask your health care provider when it is safe to drive. Activity  Do not do any activities that require great strength and energy (are vigorous) for as long as told by your health care provider.  Return to your normal activities as told by your health care provider. Ask your health care provider what activities are safe for you.  Do not lift anything that is heavier than 10  lb (4.5 kg) until your health care provider says that it is safe. General instructions  Take over-the-counter and prescription medicines only as told by your health care provider.  Keep all follow-up visits as told by your health care provider. This is important.  If you were given an athletic support strap, wear it as told by your health care provider.  If you had a drain put in during the procedure, you will need to return for a follow-up visit to have it removed. Contact a health care provider if:  Your pain is not controlled with medicine.  You have more redness or swelling around your scrotum.  You have blood or fluid coming from your scrotum.  Your incision feels warm to the touch.  You have pus or a bad smell coming from your scrotum.  You have a fever. This information is not intended to replace advice given to you by your health care provider. Make sure you discuss any questions you have with your health care provider. Document Released: 09/28/2014 Document Revised: 10/07/2015 Document Reviewed: 10/07/2015 Elsevier Interactive Patient Education  2017 [employer]   AMBULATORY SURGERY  DISCHARGE INSTRUCTIONS   1) The drugs that you were given will stay in your system until tomorrow so for the next 24 hours you should not:  A) Drive an automobile B) Make any legal decisions C) Drink any alcoholic beverage   2) You may resume regular meals tomorrow.  Today it is better to start  with liquids and gradually work up to solid foods.  You may eat anything you prefer, but it is better to start with liquids, then soup and crackers, and gradually work up to solid foods.   3) Please notify your doctor immediately if you have any unusual bleeding, trouble breathing, redness and pain at the surgery site, drainage, fever, or pain not relieved by medication.    4) Additional Instructions:        Please contact your physician with any problems or Same Day Surgery at  [phone], Monday through Friday 6 am to 4 pm, or [place] at [facility] Main number at [phone].AMBULATORY SURGERY  DISCHARGE INSTRUCTIONS   5) The drugs that you were given will stay in your system until tomorrow so for the next 24 hours you should not:  D) Drive an automobile E) Make any legal decisions F) Drink any alcoholic beverage   6) You may resume regular meals tomorrow.  Today it is better to start with liquids and gradually work up to solid foods.  You may eat anything you prefer, but it is better to start with liquids, then soup and crackers, and gradually work up to solid foods.   7) Please notify your doctor immediately if you have any unusual bleeding, trouble breathing, redness and pain at the surgery site, drainage, fever, or pain not relieved by medication.    8) Additional Instructions:        Please contact your physician with any problems or Same Day Surgery at [phone], Monday through Friday 6 am to 4 pm, or [place] at [facility] Main number at [phone].

## 2016-01-29 NOTE — H&P (Addendum)
11/09/2015  --> updated on AM of 01/29/15 without change. RRR. CTAB. 3:12 PM   Shane Bowen 10-16-1945 [id number]  Referring provider: Alba CoryKrichna Sowles, MD [address] [place], [place] [id number]      Chief Complaint  Patient presents with  . Hydrocele    discuss options    HPI: 71 year old male with multiple GU issues including history of BPH with lots, elevated PSA, erectile dysfunction, and gross hematuria who presents today for surgical evaluation of his enlarging right hydrocele. Incidentally, he is scheduled next week for CT urogram and cystoscopy to complete his gross hematuria workup.  He notes that he's had an enlarging right hemiscrotum for approximately the past 4 years. This is not painful but it is bothersome given the size and he has difficulty finding sleep positions and pants to fit. He is ready for surgical intervention for this.  He denies any scrotal trauma or infections. He has been told in the past that the enlarging right hemiscrotum was secondary to a spermatocele rather than hydrocele.  Most recent scrotal imaging shows an 11.9 x 9.2 x 10.7 cm right hydrocele, otherwise no testicular or scrotal abnormalities.   PMH:     Past Medical History:  Diagnosis Date  . Abnormal ECG   . Androgen deficiency   . Bilateral renal cysts   . BPH with elevated PSA   . Chronic insomnia   . Epididymal cyst   . Erectile dysfunction   . Gross hematuria   . Hepatitis    age 71   . Hydrocele, right   . Hyperglycemia   . Hyperlipidemia   . Hypertension   . Incomplete bladder emptying   . Insomnia   . Lumbago   . Metabolic syndrome   . Obstructive sleep apnea    CPAP  . Over weight   . Post-polio syndrome   . Scrotal pain   . Vitamin D deficiency     Surgical History:      Past Surgical History:  Procedure Laterality Date  . ANKLE FUSION Right 05/2009  . COLONOSCOPY    . COLONOSCOPY WITH PROPOFOL  N/A 04/27/2015   Procedure: COLONOSCOPY WITH PROPOFOL;  Surgeon: Midge Miniumarren Wohl, MD;  [facility];  Service: Endoscopy;  Laterality: N/A;  CPAP  . INGUINAL HERNIA REPAIR    . LUMBAR LAMINECTOMY    . Right leg surgery     from polio age 89  . TENDON GRAFT Right 2009   Hand after Lacertion Injury     Home Medications:        Medication List           Accurate as of 11/09/15  3:12 PM. Always use your most recent med list.           aspirin 81 MG tablet Take 1 tablet by mouth daily.  atorvastatin 40 MG tablet Commonly known as:  LIPITOR Take 1 tablet (40 mg total) by mouth daily.  benazepril 20 MG tablet Commonly known as:  LOTENSIN Take 1 tablet (20 mg total) by mouth daily.  saw palmetto 500 MG capsule Take 500 mg by mouth daily.  vitamin B-12 1000 MCG tablet Commonly known as:  CYANOCOBALAMIN Take 1,000 mcg by mouth daily.      Allergies:       Allergies  Allergen Reactions  . Duloxetine Hcl     Doesn't remember reaction    Family History:       Family History  Problem Relation Age  of Onset  . Hyperlipidemia Mother   . Heart disease Father   . Heart disease Sister     ATF  . Prostate cancer Neg Hx   . Kidney disease Neg Hx     Social History:  reports that he quit smoking about 22 years ago. His smoking use included Cigarettes. He started smoking about 47 years ago. He has a 37.50 pack-year smoking history. He has never used smokeless tobacco. He reports that he drinks about 4.2 oz of alcohol per week . He reports that he does not use drugs.  ROS: UROLOGY Frequent Urination?: Yes Hard to postpone urination?: No Burning/pain with urination?: No Get up at night to urinate?: No Leakage of urine?: No Urine stream starts and stops?: Yes Trouble starting stream?: No Do you have to strain to urinate?: No Blood in urine?: Yes Urinary tract infection?: No Sexually transmitted disease?: No Injury to kidneys  or bladder?: No Painful intercourse?: No Weak stream?: No Erection problems?: No Penile pain?: No  Gastrointestinal Nausea?: No Vomiting?: No Indigestion/heartburn?: No Diarrhea?: No Constipation?: No  Constitutional Fever: No Night sweats?: No Weight loss?: No Fatigue?: No  Skin Skin rash/lesions?: No Itching?: No  Eyes Blurred vision?: No Double vision?: No  Ears/Nose/Throat Sore throat?: No Sinus problems?: No  Hematologic/Lymphatic Swollen glands?: No Easy bruising?: No  Cardiovascular Leg swelling?: No Chest pain?: No  Respiratory Cough?: No Shortness of breath?: No  Endocrine Excessive thirst?: No  Musculoskeletal Back pain?: No Joint pain?: No  Neurological Headaches?: No Dizziness?: No  Psychologic Depression?: No Anxiety?: No  Physical Exam: BP (!) 160/79   Pulse 80   Ht 5\' 9"  (1.753 m)   Wt 208 lb (94.3 kg)   BMI 30.72 kg/m   Constitutional:  Alert and oriented, No acute distress. HEENT: [place] AT, moist mucus membranes.  Trachea midline, no masses. Cardiovascular: No clubbing, cyanosis, or edema. Respiratory: Normal respiratory effort, no increased work of breathing. GI: Abdomen is soft, nontender, nondistended, no abdominal masses GU: Circumcised buried phallus. Large honeydew melon sized right presumed hydrocele although do palpate soft tissue density on the posterior aspect which may possibly be the testicle and as such, this would represent a spermatocele. Left testicle somewhat displaced with mass effect from hydrocele, otherwise unremarkable, no masses, nontender. Skin: No rashes, bruises or suspicious lesions. Lymph: No cervical or inguinal adenopathy. Neurologic: Grossly intact, no focal deficits, moving all 4 extremities. Psychiatric: Normal mood and affect.  Laboratory Data: RecentLabs       Lab Results  Component Value Date   WBC 5.5 10/04/2015   HGB 15.6 10/04/2015   HCT 46.2 10/04/2015   MCV  86.0 10/04/2015   PLT 162 10/04/2015      RecentLabs       Lab Results  Component Value Date   CREATININE 0.72 10/04/2015      RecentLabs       Lab Results  Component Value Date   PSA Normal 02/25/2014      RecentLabs       Lab Results  Component Value Date   HGBA1C 5.0 10/04/2015       Pertinent Imaging: CLINICAL DATA: Hydrocele.  EXAM: SCROTAL ULTRASOUND  DOPPLER ULTRASOUND OF THE TESTICLES  TECHNIQUE: Complete ultrasound examination of the testicles, epididymis, and other scrotal structures was performed. Color and spectral Doppler ultrasound were also utilized to evaluate blood flow to the testicles.  COMPARISON: 04/22/2014.  FINDINGS: Right testicle  Measurements: 5.8 x 2.8 x 3.8 cm. No mass or  microlithiasis visualized.  Left testicle  Measurements: 5.7 x 2.3 x 3.8 cm. No mass or microlithiasis visualized.  Right epididymis: Normal in size and appearance.  Left epididymis: Normal in size and appearance.  Hydrocele: 11.9 x 9.2 x 10.7 cm right hydrocele noted. This is increased in size from prior exam.  Varicocele: None visualized.  Pulsed Doppler interrogation of both testes demonstrates normal low resistance arterial and venous waveforms bilaterally.  IMPRESSION: 11.9 x 9.2 x 10.7 cm right hydrocele noted. This has increased in size from prior exam. No other focal abnormality identified.   Electronically Signed By: Maisie Fus Register On: 10/26/2015 06:26  Scrotal ultrasound personally reviewed today.  Assessment & Plan:    1. Right hydrocele versus spermatocele Symptomatic right hydrocele although could possibly represent a spermatocele with a slightly effaced/displaced right testicle posteriorly.  I recommended surgical intervention for this. We discussed percutaneous drainage versus hydrocelectomy in the OR. Risks and benefits of each of the procedures were discussed in detail. We  discussed the efficacy rates of each. Preoperative, intraoperative, and postoperative details were also discussed. Risks including bleeding, infection, pain, damage to surrounding structures, need for repeat procedures, recurrence, need for possible drain, and chronic testicular pain were all discussed.  Also discussed that intraoperatively, if determined that this represents a spermatocele rather thanhydrocele, will proceed with spermatocelectomy versus epididymectomy which will be determined at the time of the procedure. He understands that epididymectomy may result in decreased fertility but will also help reduce the risk of spermatocele recurrence. All his questions were answered. As long as his hematuria workup is negative, he would like to proceed with surgery after the holidays.  He is on 81 mg of aspirin which will need to be held prior to surgery. He has no cardiac risk factors.   No other GU issues were addressed today.   Vanna Scotland, MD  [facility] [address] [place], [place] [address] [phone]

## 2016-02-01 ENCOUNTER — Encounter: Payer: Self-pay | Admitting: Urology

## 2016-02-01 ENCOUNTER — Ambulatory Visit (INDEPENDENT_AMBULATORY_CARE_PROVIDER_SITE_OTHER): Payer: Medicare Other | Admitting: Urology

## 2016-02-01 VITALS — BP 137/81 | HR 80 | Ht 69.0 in | Wt 214.9 lb

## 2016-02-01 DIAGNOSIS — N401 Enlarged prostate with lower urinary tract symptoms: Secondary | ICD-10-CM | POA: Diagnosis not present

## 2016-02-01 DIAGNOSIS — N433 Hydrocele, unspecified: Secondary | ICD-10-CM

## 2016-02-01 DIAGNOSIS — N138 Other obstructive and reflux uropathy: Secondary | ICD-10-CM | POA: Diagnosis not present

## 2016-02-01 NOTE — Progress Notes (Signed)
02/01/2016 1:55 PM   Shane Bowen Sep 18, 1945 [id number]  Referring provider: Alba Cory, MD [address] [place], [place] [address]  Chief Complaint  Patient presents with  . Wound Check    HPI: Patient is a 71 year old Caucasian male with an erectile dysfunction, BPH with LUTS, history of elevated PSA, history of gross hematuria and hydrocele who presents today for a wound recheck for a right hydrocelectomy.    Patient underwent a right hydrocelectomy on 01/29/2016 for a large right hydrocele.  There were no complications during the procedure.  His post operative course was as expected and uneventful.    His scrotum is swollen, but he has not had any drainage or pus.  He has not had fevers, chills, nausea or vomiting.   He has not had any difficulty with urination.  He did mention that finasteride 5 mg daily may have caused dizziness, so he discontinue the medication.     PMH: Past Medical History:  Diagnosis Date  . Abnormal ECG   . Androgen deficiency   . Anemia   . Bilateral renal cysts   . BPH with elevated PSA   . Chronic insomnia   . Epididymal cyst   . Erectile dysfunction   . Gross hematuria   . Hepatitis    age 21 hepatitis A  . Hydrocele, right   . Hyperglycemia   . Hyperlipidemia   . Hypertension   . Incomplete bladder emptying   . Insomnia   . Lumbago   . Metabolic syndrome   . Obstructive sleep apnea    CPAP  . Over weight   . Post-polio syndrome   . Prostate enlargement   . Scrotal pain   . Vitamin D deficiency     Surgical History: Past Surgical History:  Procedure Laterality Date  . ANKLE FUSION Right 05/2009  . COLONOSCOPY    . COLONOSCOPY WITH PROPOFOL N/A 04/27/2015   Procedure: COLONOSCOPY WITH PROPOFOL;  Surgeon: Midge Minium, MD;  Location: [facility];  Service: Endoscopy;  Laterality: N/A;  CPAP  . HYDROCELE EXCISION Right 01/29/2016   Procedure: HYDROCELECTOMY ADULT;  Surgeon: Vanna Scotland, MD;   Location: [facility];  Service: Urology;  Laterality: Right;  . INGUINAL HERNIA REPAIR    . LUMBAR LAMINECTOMY    . Right leg surgery     from polio age 64  . TENDON GRAFT Right 2009   Hand after Lacertion Injury     Home Medications:  Allergies as of 02/01/2016      Reactions   Duloxetine Hcl    Doesn't remember reaction      Medication List       Accurate as of 02/01/16  1:55 PM. Always use your most recent med list.          aspirin 81 MG tablet Take 81 mg by mouth daily.   atorvastatin 40 MG tablet Commonly known as:  LIPITOR Take 1 tablet (40 mg total) by mouth daily.   benazepril 20 MG tablet Commonly known as:  LOTENSIN Take 1 tablet (20 mg total) by mouth daily.   docusate sodium 100 MG capsule Commonly known as:  COLACE Take 1 capsule (100 mg total) by mouth 2 (two) times daily.   finasteride 5 MG tablet Commonly known as:  PROSCAR Take 1 tablet (5 mg total) by mouth daily.   HYDROcodone-acetaminophen 5-325 MG tablet Commonly known as:  NORCO/VICODIN Take 1-2 tablets by mouth every 6 (six) hours as needed for  moderate pain.   ibuprofen 200 MG tablet Commonly known as:  ADVIL,MOTRIN Take 400 mg by mouth 2 (two) times daily as needed for headache or moderate pain.   Melatonin 2.5 MG Caps Take 2.5 mg by mouth at bedtime as needed (sleep).   naproxen sodium 220 MG tablet Commonly known as:  ANAPROX Take 220 mg by mouth 2 (two) times daily as needed (pain).   saw palmetto 500 MG capsule Take 500 mg by mouth daily.   Saw Palmetto 450 MG Caps Take 1 capsule by mouth daily.   vitamin B-12 1000 MCG tablet Commonly known as:  CYANOCOBALAMIN Take 1,000 mcg by mouth daily.       Allergies:  Allergies  Allergen Reactions  . Duloxetine Hcl     Doesn't remember reaction    Family History: Family History  Problem Relation Age of Onset  . Hyperlipidemia Mother   . Heart disease Father   . Heart disease Sister     ATF  . Prostate cancer Neg Hx     . Kidney disease Neg Hx     Social History:  reports that he quit smoking about 23 years ago. His smoking use included Cigarettes. He started smoking about 48 years ago. He has a 37.50 pack-year smoking history. He has never used smokeless tobacco. He reports that he drinks about 4.2 oz of alcohol per week . He reports that he does not use drugs.  ROS: UROLOGY Frequent Urination?: No Hard to postpone urination?: No Burning/pain with urination?: No Get up at night to urinate?: Yes Leakage of urine?: No Urine stream starts and stops?: Yes Trouble starting stream?: No Do you have to strain to urinate?: No Blood in urine?: No Urinary tract infection?: No Sexually transmitted disease?: No Injury to kidneys or bladder?: No Painful intercourse?: No Weak stream?: No Erection problems?: No Penile pain?: No  Gastrointestinal Nausea?: No Vomiting?: No Indigestion/heartburn?: No Diarrhea?: No Constipation?: No  Constitutional Fever: No Night sweats?: No Weight loss?: No Fatigue?: No  Skin Skin rash/lesions?: No Itching?: No  Eyes Blurred vision?: No Double vision?: No  Ears/Nose/Throat Sore throat?: No Sinus problems?: No  Hematologic/Lymphatic Swollen glands?: No Easy bruising?: No  Cardiovascular Leg swelling?: No Chest pain?: No  Respiratory Cough?: No Shortness of breath?: No  Endocrine Excessive thirst?: No  Musculoskeletal Back pain?: No Joint pain?: No  Neurological Headaches?: No Dizziness?: No  Psychologic Depression?: No Anxiety?: No  Physical Exam: BP 137/81 (BP Location: Left Arm, Patient Position: Sitting, Cuff Size: Normal)   Pulse 80   Ht 5\' 9"  (1.753 m)   Wt 214 lb 14.4 oz (97.5 kg)   BMI 31.74 kg/m   Constitutional: Well nourished. Alert and oriented, No acute distress. HEENT: [place] AT, moist mucus membranes. Trachea midline, no masses. Cardiovascular: No clubbing, cyanosis, or edema. Respiratory: Normal respiratory effort,  no increased work of breathing. GI: Abdomen is soft, non tender, non distended, no abdominal masses. Liver and spleen not palpable.  No hernias appreciated.  Stool sample for occult testing is not indicated.   GU: No CVA tenderness.  No bladder fullness or masses.  Patient with circumcised phallus.   Urethral meatus is patent.  No penile discharge. No penile lesions or rashes. Scrotum without lesions, cysts, rashes and/or edema.  Right scrotum with swelling and bruising.  Incision clean and dry.  No seepage.   Rectal: Deferred.  Skin: No rashes, bruises or suspicious lesions. Lymph: No cervical or inguinal adenopathy. Neurologic: Grossly intact, no focal  deficits, moving all 4 extremities. Psychiatric: Normal mood and affect.  Laboratory Data:  Lab Results  Component Value Date   CREATININE 0.72 10/04/2015   Lab Results  Component Value Date   HGBA1C 5.0 10/04/2015      Component Value Date/Time   CHOL 126 10/04/2015 0838   CHOL 130 09/30/2014 0949   HDL 70 10/04/2015 0838   HDL 53 09/30/2014 0949   CHOLHDL 1.8 10/04/2015 0838   VLDL 14 10/04/2015 0838   LDLCALC 42 10/04/2015 0838   LDLCALC 54 09/30/2014 0949   Lab Results  Component Value Date   AST 21 10/04/2015   Lab Results  Component Value Date   ALT 21 10/04/2015   PSA History  4.3 ng/mL on 10/17/2014  3.5 ng/mL on 11/10/2014  3.9 ng/mL on 04/11/2015  4.2 ng/mL on 10/12/2015    Assessment & Plan:    1. BPH (benign prostatic hyperplasia) with LUTS  - patient will retry the finasteride  - RTC in one month for I PSS, PSA and exam  2. Hydrocele:   S/p hydrocelectomy, patient doing well  - advised patient to continue weight restrictions  - contact the office if he should experience fevers, chills, intractable pain or purulent drainage from the site    Return in about 1 month (around 03/03/2016) for IPSS, PSA and exam.  These notes generated with voice recognition software. I apologize for typographical  errors.  Michiel Cowboy, PA-C  [facility] [address] [place], [place] [address] [phone]

## 2016-02-21 ENCOUNTER — Encounter: Payer: Self-pay | Admitting: Urology

## 2016-02-22 ENCOUNTER — Ambulatory Visit (INDEPENDENT_AMBULATORY_CARE_PROVIDER_SITE_OTHER): Payer: Medicare Other | Admitting: Urology

## 2016-02-22 ENCOUNTER — Encounter: Payer: Self-pay | Admitting: Urology

## 2016-02-22 VITALS — BP 145/84 | HR 85 | Ht 69.0 in | Wt 211.4 lb

## 2016-02-22 DIAGNOSIS — N433 Hydrocele, unspecified: Secondary | ICD-10-CM

## 2016-02-22 NOTE — Progress Notes (Signed)
02/22/2016 3:30 PM   Shane Bowen Register [birth year] [id number]  Referring provider: Alba Cory, MD [address] [place], [place] [address]  Chief Complaint  Patient presents with  . Post-op Problem    HPI: 71 year old male who returns today for postop visit following right hydrocelectomy on 01/29/2016. He reports that he had no issues immediately postop and his scrotum shrunk down to about the sizable walnut on that side. Over the past 2-3 days, his scrotum has ballooned back up to its preoperative size.  He denies any scrotal pain, redness, drainage, fevers, chills, or deviation of his baseline urinary symptoms.  He continues to wear a scrotal support device as needed. He denies any scrotal trauma.  He denies any overt scrotal pain but is disappointed in the size of his scrotum.   PMH: Past Medical History:  Diagnosis Date  . Abnormal ECG   . Androgen deficiency   . Anemia   . Bilateral renal cysts   . BPH with elevated PSA   . Chronic insomnia   . Epididymal cyst   . Erectile dysfunction   . Gross hematuria   . Hepatitis    age 74 hepatitis A  . Hydrocele, right   . Hyperglycemia   . Hyperlipidemia   . Hypertension   . Incomplete bladder emptying   . Insomnia   . Lumbago   . Metabolic syndrome   . Obstructive sleep apnea    CPAP  . Over weight   . Post-polio syndrome   . Prostate enlargement   . Scrotal pain   . Vitamin D deficiency     Surgical History: Past Surgical History:  Procedure Laterality Date  . ANKLE FUSION Right 05/2009  . COLONOSCOPY    . COLONOSCOPY WITH PROPOFOL N/A 04/27/2015   Procedure: COLONOSCOPY WITH PROPOFOL;  Surgeon: Midge Minium, MD;  Location: [facility];  Service: Endoscopy;  Laterality: N/A;  CPAP  . HYDROCELE EXCISION Right 01/29/2016   Procedure: HYDROCELECTOMY ADULT;  Surgeon: Vanna Scotland, MD;  Location: [facility];  Service: Urology;  Laterality: Right;  . INGUINAL HERNIA REPAIR    . LUMBAR  LAMINECTOMY    . Right leg surgery     from polio age 76  . TENDON GRAFT Right 2009   Hand after Lacertion Injury     Home Medications:  Allergies as of 02/22/2016      Reactions   Duloxetine Hcl    Doesn't remember reaction      Medication List       Accurate as of 02/22/16  3:30 PM. Always use your most recent med list.          aspirin 81 MG tablet Take 81 mg by mouth daily.   atorvastatin 40 MG tablet Commonly known as:  LIPITOR Take 1 tablet (40 mg total) by mouth daily.   benazepril 20 MG tablet Commonly known as:  LOTENSIN Take 1 tablet (20 mg total) by mouth daily.   docusate sodium 100 MG capsule Commonly known as:  COLACE Take 1 capsule (100 mg total) by mouth 2 (two) times daily.   finasteride 5 MG tablet Commonly known as:  PROSCAR Take 1 tablet (5 mg total) by mouth daily.   HYDROcodone-acetaminophen 5-325 MG tablet Commonly known as:  NORCO/VICODIN Take 1-2 tablets by mouth every 6 (six) hours as needed for moderate pain.   ibuprofen 200 MG tablet Commonly known as:  ADVIL,MOTRIN Take 400 mg by mouth 2 (two) times daily as needed for  headache or moderate pain.   Melatonin 2.5 MG Caps Take 2.5 mg by mouth at bedtime as needed (sleep).   naproxen sodium 220 MG tablet Commonly known as:  ANAPROX Take 220 mg by mouth 2 (two) times daily as needed (pain).   saw palmetto 500 MG capsule Take 500 mg by mouth daily.   vitamin B-12 1000 MCG tablet Commonly known as:  CYANOCOBALAMIN Take 1,000 mcg by mouth daily.       Allergies:  Allergies  Allergen Reactions  . Duloxetine Hcl     Doesn't remember reaction    Family History: Family History  Problem Relation Age of Onset  . Hyperlipidemia Mother   . Heart disease Father   . Heart disease Sister     ATF  . Prostate cancer Neg Hx   . Kidney disease Neg Hx     Social History:  reports that he quit smoking about 23 years ago. His smoking use included Cigarettes. He started smoking about 48  years ago. He has a 37.50 pack-year smoking history. He has never used smokeless tobacco. He reports that he drinks about 4.2 oz of alcohol per week . He reports that he does not use drugs.  ROS: UROLOGY Frequent Urination?: No Hard to postpone urination?: No Burning/pain with urination?: No Get up at night to urinate?: No Leakage of urine?: No Urine stream starts and stops?: No Trouble starting stream?: No Do you have to strain to urinate?: No Blood in urine?: No Urinary tract infection?: No Sexually transmitted disease?: No Injury to kidneys or bladder?: No Painful intercourse?: No Weak stream?: No Erection problems?: No Penile pain?: No  Gastrointestinal Nausea?: No Vomiting?: No Indigestion/heartburn?: No Diarrhea?: No Constipation?: No  Constitutional Fever: No Night sweats?: No Weight loss?: No Fatigue?: No  Skin Skin rash/lesions?: No Itching?: No  Eyes Blurred vision?: No Double vision?: No  Ears/Nose/Throat Sore throat?: No Sinus problems?: No  Hematologic/Lymphatic Swollen glands?: No Easy bruising?: No  Cardiovascular Leg swelling?: No Chest pain?: No  Respiratory Cough?: No Shortness of breath?: No  Endocrine Excessive thirst?: No  Musculoskeletal Back pain?: No Joint pain?: No  Neurological Headaches?: No Dizziness?: No  Psychologic Depression?: No Anxiety?: No  Physical Exam: BP (!) 145/84 (BP Location: Left Arm, Patient Position: Sitting, Cuff Size: Normal)   Pulse 85   Ht 5\' 9"  (1.753 m)   Wt 211 lb 6.4 oz (95.9 kg)   BMI 31.22 kg/m   Constitutional:  Alert and oriented, No acute distress. HEENT: [place] AT, moist mucus membranes.  Trachea midline, no masses. Cardiovascular: No clubbing, cyanosis, or edema. Respiratory: Normal respiratory effort, no increased work of breathing. GI: Abdomen is soft, nontender, nondistended, no abdominal masses GU: Right hemiscrotal incision healing well, no suspicious skin changes,  redness, or drainage. Right hemiscrotum is a large, most consistent with recurrent hydrocele approximately softball sized extending into the inguinal canal. Left hemi-scrotum unremarkable, testicle normal. Skin: No rashes, bruises or suspicious lesions. Lymph: No inguinal adenopathy. Neurologic: Grossly intact, no focal deficits, moving all 4 extremities. Psychiatric: Normal mood and affect.  Laboratory Data: Lab Results  Component Value Date   WBC 5.5 10/04/2015   HGB 15.6 10/04/2015   HCT 46.2 10/04/2015   MCV 86.0 10/04/2015   PLT 162 10/04/2015    Lab Results  Component Value Date   CREATININE 0.72 10/04/2015    Lab Results  Component Value Date   PSA Normal 02/25/2014     Lab Results  Component Value Date  HGBA1C 5.0 10/04/2015    Urinalysis    Component Value Date/Time   APPEARANCEUR Clear 11/15/2015 1415   GLUCOSEU Negative 11/15/2015 1415   BILIRUBINUR Negative 11/15/2015 1415   PROTEINUR Negative 11/15/2015 1415   NITRITE Negative 11/15/2015 1415   LEUKOCYTESUR Negative 11/15/2015 1415    Pertinent Imaging: n/a  Assessment & Plan:    1. Right hydrocele No evidence of postoperative hematoma, infection, significant edema or any other concerning findings. Exam today is suspicious for recurrence of his hydrocele which may simply be reactive.  I recommended observation and scrotal support for the next month. We'll have him follow-up around that time with a scrotal ultrasound prior to his visit.  Warning symptoms and indication for earlier reassessment were reviewed today in detail.  - [place] Art/Ven Flow Abd Pelv Doppler; Future - US Scrotum; Future  2. BPH/ ED/ history of elevated PSA Plan to address routing Urological dx at next visit  Return in about 1 month (around 03/21/2016) for f/u scrotal ultrasound, PSA/ DRE, IPSS (reschedule  next week visit).  Vanna Scotland, MD  [facility] [address] [place],  [place] [address] [phone]

## 2016-02-27 ENCOUNTER — Ambulatory Visit: Payer: Medicare Other | Admitting: Urology

## 2016-03-04 ENCOUNTER — Other Ambulatory Visit: Payer: Medicare Other

## 2016-03-06 ENCOUNTER — Ambulatory Visit: Payer: Medicare Other | Admitting: Urology

## 2016-03-21 ENCOUNTER — Ambulatory Visit: Payer: Medicare Other

## 2016-03-21 ENCOUNTER — Ambulatory Visit
Admission: RE | Admit: 2016-03-21 | Discharge: 2016-03-21 | Disposition: A | Discharge status: Home / Self Care | Payer: Medicare Other | Source: Ambulatory Visit | Attending: Urology | Admitting: Urology

## 2016-03-21 DIAGNOSIS — N433 Hydrocele, unspecified: Secondary | ICD-10-CM | POA: Diagnosis not present

## 2016-03-22 ENCOUNTER — Ambulatory Visit: Payer: Medicare Other

## 2016-04-01 ENCOUNTER — Ambulatory Visit (INDEPENDENT_AMBULATORY_CARE_PROVIDER_SITE_OTHER): Payer: Medicare Other | Admitting: Family Medicine

## 2016-04-01 ENCOUNTER — Encounter: Payer: Self-pay | Admitting: Family Medicine

## 2016-04-01 VITALS — BP 124/68 | HR 84 | Temp 98.0°F | Resp 16 | Ht 69.0 in | Wt 218.2 lb

## 2016-04-01 DIAGNOSIS — R7309 Other abnormal glucose: Secondary | ICD-10-CM

## 2016-04-01 DIAGNOSIS — N4340 Spermatocele of epididymis, unspecified: Secondary | ICD-10-CM | POA: Diagnosis not present

## 2016-04-01 DIAGNOSIS — E559 Vitamin D deficiency, unspecified: Secondary | ICD-10-CM | POA: Diagnosis not present

## 2016-04-01 DIAGNOSIS — E785 Hyperlipidemia, unspecified: Secondary | ICD-10-CM | POA: Diagnosis not present

## 2016-04-01 DIAGNOSIS — R2681 Unsteadiness on feet: Secondary | ICD-10-CM | POA: Diagnosis not present

## 2016-04-01 DIAGNOSIS — I10 Essential (primary) hypertension: Secondary | ICD-10-CM

## 2016-04-01 DIAGNOSIS — G4733 Obstructive sleep apnea (adult) (pediatric): Secondary | ICD-10-CM | POA: Diagnosis not present

## 2016-04-01 MED ORDER — AMLODIPINE BESY-BENAZEPRIL HCL 2.5-10 MG PO CAPS: 1.0000 | ORAL_CAPSULE | Freq: Every day | ORAL | 0 refills | Status: DC

## 2016-04-01 MED ORDER — VITAMIN D 50 MCG (2000 UT) PO CAPS: 1.0000 | ORAL_CAPSULE | Freq: Every day | ORAL | 0 refills | Status: AC

## 2016-04-01 MED ORDER — ATORVASTATIN CALCIUM 40 MG PO TABS: 40.0000 mg | ORAL_TABLET | Freq: Every day | ORAL | 1 refills | Status: DC

## 2016-04-01 NOTE — [place] Notes (Signed)
Name: Shane Bowen   MRN: [id number]    DOB: 1945/06/21   Date:04/01/2016       Progress Note  Subjective  Chief Complaint  No chief complaint on file.   HPI  OSA: he wears APAP every night. He likes the new machine, no snoring.   HTN: bp here is at goal. He states bp at home in the pm's and would like to go back on Norvasc. No chest pain or palpitation  Dyslipidemia: lipid panel looked normal, but his ASCVD was 14% and he is now on statin therapy, denies side effects. He forget to stop statin therapy and is willing to continue taking it.  Weight loss: he has lost 64 lbs in the past year, he states he started to lose weight after tooth abscess, and got motivated to stay on a diet after that ( started Dec 2016), he feels good. He states he has cut out carbohydrates, no desserts, smaller portions, and replaced his snacks with high protein snacks instead of chips, and he has been  drinking broth late at night instead of eating. Weight is now stable, hovering between 210-220 lbs.   Hydrocele: he had surgery but had recurrence very quickly and is larger now  Gait instability: he is doing well , still using a cane and has a brace that is helping with his gait. No falls.   Patient Active Problem List   Diagnosis Date Noted  . Special screening for malignant neoplasms, colon   . Other hydrocele 04/17/2015  . History of elevated PSA 04/17/2015  . Change in bowel movement 03/30/2015  . Elevated PSA 10/17/2014  . History of hematuria 10/17/2014  . Erectile dysfunction of organic origin 10/17/2014  . History of poliomyelitis 09/30/2014  . Gait instability 09/30/2014  . 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 Umbilical hernia without obstruction or gangrene 09/30/2014  . Kidney cysts 07/28/2014  . BPH (benign prostatic hyperplasia) 07/28/2014  . Insomnia, persistent 07/28/2014  . Dyslipidemia 07/28/2014  . Impotence of organic origin 07/28/2014  . Essential (primary) hypertension  07/28/2014  . Blood glucose elevated 07/28/2014  . Male hypogonadism 07/28/2014  . Obesity (BMI 30-39.9) 07/28/2014  . Obstructive apnea 07/28/2014  . Spermatocele 07/28/2014  . Vitamin D deficiency 11/10/2008    Past Surgical History:  Procedure Laterality Date  . ANKLE FUSION Right 05/2009  . COLONOSCOPY    . COLONOSCOPY WITH PROPOFOL N/A 04/27/2015   Procedure: COLONOSCOPY WITH PROPOFOL;  Surgeon: Midge Minium, MD;  Location[facility];  Service: Endoscopy;  Laterality: N/A;  CPAP  . HYDROCELE EXCISION Right 01/29/2016   Procedure: HYDROCELECTOMY ADULT;  Surgeon: Vanna Scotland, MD;  Location: [facility];  Service: Urology;  Laterality: Right;  . INGUINAL HERNIA REPAIR    . LUMBAR LAMINECTOMY    . Right leg surgery     from polio age 60  . TENDON GRAFT Right 2009   Hand after Lacertion Injury     Family History  Problem Relation Age of Onset  . Hyperlipidemia Mother   . Heart disease Father   . Heart disease Sister     ATF  . Prostate cancer Neg Hx   . Kidney disease Neg Hx     Social History   Social History  . Marital status: Widowed    Spouse name: N/A  . Number of children: N/A  . Years of education: N/A   Occupational History  . Not on file.   Social History Main Topics  . Smoking status:  Former Smoker    Packs/day: 1.50    Years: 25.00    Types: Cigarettes    Start date: 01/22/1968    Quit date: 01/21/1993  . Smokeless tobacco: Never Used  . Alcohol use 4.2 oz/week    7 Glasses of wine per week     Comment:    . Drug use: No  . Sexual activity: Not Currently   Other Topics Concern  . Not on file   Social History Narrative  . No narrative on file     Current Outpatient Prescriptions:  .  amlodipine-benazepril (LOTREL) 2.5-10 MG capsule, Take 1 capsule by mouth daily., Disp: 30 capsule, Rfl: 0 .  aspirin 81 MG tablet, Take 81 mg by mouth daily. , Disp: , Rfl:  .  atorvastatin (LIPITOR) 40 MG tablet, Take 1 tablet (40 mg total) by mouth  daily., Disp: 90 tablet, Rfl: 1 .  finasteride (PROSCAR) 5 MG tablet, Take 1 tablet (5 mg total) by mouth daily., Disp: 30 tablet, Rfl: 6 .  Melatonin 2.5 MG CAPS, Take 2.5 mg by mouth at bedtime as needed (sleep)., Disp: , Rfl:  .  naproxen sodium (ANAPROX) 220 MG tablet, Take 220 mg by mouth 2 (two) times daily as needed (pain)., Disp: , Rfl:  .  saw palmetto 500 MG capsule, Take 500 mg by mouth daily., Disp: , Rfl:  .  vitamin B-12 (CYANOCOBALAMIN) 1000 MCG tablet, Take 1,000 mcg by mouth daily., Disp: , Rfl:   Allergies  Allergen Reactions  . Duloxetine Hcl     Doesn't remember reaction     ROS  Constitutional: Negative for fever, positive for  weight change.  Respiratory: Negative for cough and shortness of breath.   Cardiovascular: Negative for chest pain or palpitations.  Gastrointestinal: Negative for abdominal pain, no bowel changes.  Musculoskeletal: Negative for gait problem or joint swelling.  Skin: Negative for rash.  Neurological: Negative for dizziness or headache.  No other specific complaints in a complete review of systems (except as listed in HPI above).  Objective  Vitals:   04/01/16 0854  BP: 124/68  Pulse: 84  Resp: 16  Temp: 98 F (36.7 C)  SpO2: 93%  Weight: 218 lb 3 oz (99 kg)  Height: 5\' 9"  (1.753 m)    Body mass index is 32.22 kg/m.  Physical Exam  Constitutional: Patient appears well-developed and well-nourished. Obese No distress.  HEENT: head atraumatic, normocephalic, pupils equal and reactive to light, neck supple, throat within normal limits Cardiovascular: Normal rate, regular rhythm and normal heart sounds.  No murmur heard. No BLE edema. Pulmonary/Chest: Effort normal and breath sounds normal. No respiratory distress. Abdominal: Soft.  There is no tenderness. Psychiatric: Patient has a normal mood and affect. behavior is normal. Judgment and thought content normal.  PHQ2/9: Depression screen [facility] 2/9 04/01/2016 10/03/2015 03/30/2015  09/30/2014 07/29/2014  Decreased Interest 0 0 0 0 0  Down, Depressed, Hopeless 0 0 0 0 0  PHQ - 2 Score 0 0 0 0 0     Fall Risk: Fall Risk  04/01/2016 10/03/2015 03/30/2015 09/30/2014 07/29/2014  Falls in the past year? No No No Yes Yes  Number falls in past yr: - - - 1 1  Injury with Fall? - - - No No     Functional Status Survey: Is the patient deaf or have difficulty hearing?: No Does the patient have difficulty seeing, even when wearing glasses/contacts?: No Does the patient have difficulty concentrating, remembering, or making decisions?: No  Does the patient have difficulty walking or climbing stairs?: Yes Does the patient have difficulty dressing or bathing?: No Does the patient have difficulty doing errands alone such as visiting a doctor's office or shopping?: No   Assessment & Plan  1. Essential (primary) hypertension  bp at home has been elevated at night, we will change to Lotensin 2.5/10 from Lotensin 20 mg and he will let me know if it works for him, otherwise we will increase dose to Lotrel to 5/20.  - amlodipine-benazepril (LOTREL) 2.5-10 MG capsule; Take 1 capsule by mouth daily.  Dispense: 30 capsule; Refill: 0  2. Elevated glucose level  Last hgbA1C was at goal, he has changed his diet and is keeping weight down.   3. Dyslipidemia  - atorvastatin (LIPITOR) 40 MG tablet; Take 1 tablet (40 mg total) by mouth daily.  Dispense: 90 tablet; Refill: 1  4. Vitamin D deficiency  He needs to resume Vitamin D   5. Obstructive apnea  He went for another sleep study and is on a A-pap   6. Gait instability  He has a brace on right leg and is helping with his gait  7. Spermatocele  Had surgery done but already has a recurrence, seeing Dr. Apolinar Junes

## 2016-04-08 ENCOUNTER — Other Ambulatory Visit: Payer: Medicare Other

## 2016-04-08 DIAGNOSIS — N400 Enlarged prostate without lower urinary tract symptoms: Secondary | ICD-10-CM

## 2016-04-09 LAB — PSA: PROSTATE SPECIFIC AG, SERUM: 2.4 ng/mL (ref 0.0–4.0)

## 2016-04-10 ENCOUNTER — Encounter: Payer: Self-pay | Admitting: Urology

## 2016-04-10 ENCOUNTER — Ambulatory Visit (INDEPENDENT_AMBULATORY_CARE_PROVIDER_SITE_OTHER): Payer: Medicare Other | Admitting: Urology

## 2016-04-10 VITALS — BP 144/78 | HR 85 | Ht 69.0 in | Wt 214.3 lb

## 2016-04-10 DIAGNOSIS — N138 Other obstructive and reflux uropathy: Secondary | ICD-10-CM

## 2016-04-10 DIAGNOSIS — N433 Hydrocele, unspecified: Secondary | ICD-10-CM

## 2016-04-10 DIAGNOSIS — Z87898 Personal history of other specified conditions: Secondary | ICD-10-CM

## 2016-04-10 DIAGNOSIS — N401 Enlarged prostate with lower urinary tract symptoms: Secondary | ICD-10-CM

## 2016-04-10 NOTE — [place] Notes (Signed)
 04/10/2016 10:39 AM   Shane Bowen 06/10/1945 [id number]  Referring provider: Krichna Sowles, MD [address] [place], [place] [address]  Chief Complaint  Patient presents with  . Hydrocele    US results    HPI: 70-year-old male who returns today for Follow-up of a scrotal ultrasound  Right hydrocele Personal history of symptomatic right hydrocele, underwent hydrocelectomy on 01/2016 at which time 650 cc of clear fluid was drained from the hydrocele sac. He initially had dramatic improvement in his scrotal size but around the 4 week mark, had recurrence of the fluid collection in his right hemiscrotum.  Follow-up scrotal ultrasound on 03/21/2016 shows reaccumulation of a complex right hydrocele measuring 1.9 x 9.1 x 11.6 cm.    He continues to be asymptomatic and bothered by the size of his hemiscrotum. He is interested in redo hydrocelectomy.  BPH with LUTS Previously on finasteride for massive BPH.  He stopped briefly for a few months and resumed 3 months ago.  Symptoms improving over past months since resuming.  IPSS as below 8/2.   History of gross hematuria CT 11/10/2015 benign, prostate about 115 grams. Most recent PSA 4.2 on 10/11/15, recheck today.      IPSS    Row Name 04/10/16 1600         International Prostate Symptom Score   How often have you had the sensation of not emptying your bladder? Less than 1 in 5     How often have you had to urinate less than every two hours? About half the time     How often have you found you stopped and started again several times when you urinated? Less than half the time     How often have you found it difficult to postpone urination? Less than 1 in 5 times     How often have you had a weak urinary stream? Less than 1 in 5 times     How often have you had to strain to start urination? Not at All     How many times did you typically get up at night to urinate? None     Total IPSS Score 8       Quality of  Life due to urinary symptoms   If you were to spend the rest of your life with your urinary condition just the way it is now how would you feel about that? Mostly Satisfied        Score:  1-7 Mild 8-19 Moderate 20-35 Severe    PMH: Past Medical History:  Diagnosis Date  . Abnormal ECG   . Androgen deficiency   . Anemia   . Bilateral renal cysts   . BPH with elevated PSA   . Chronic insomnia   . Epididymal cyst   . Erectile dysfunction   . Gross hematuria   . Hepatitis    age 15 hepatitis A  . Hydrocele, right   . Hyperglycemia   . Hyperlipidemia   . Hypertension   . Incomplete bladder emptying   . Insomnia   . Lumbago   . Metabolic syndrome   . Obstructive sleep apnea    CPAP  . Over weight   . Polio   . Post-polio syndrome   . Prostate enlargement   . Scrotal pain   . Vitamin D deficiency     Surgical History: Past Surgical History:  Procedure Laterality Date  . ANKLE FUSION Right 05/2009  . COLONOSCOPY    .   COLONOSCOPY WITH PROPOFOL N/A 04/27/2015   Procedure: COLONOSCOPY WITH PROPOFOL;  Surgeon: Darren Wohl, MD;  Location: [facility];  Service: Endoscopy;  Laterality: N/A;  CPAP  . HYDROCELE EXCISION Right 01/29/2016   Procedure: HYDROCELECTOMY ADULT;  Surgeon: Venissa Nappi, MD;  Location: [facility];  Service: Urology;  Laterality: Right;  . INGUINAL HERNIA REPAIR    . LEG SURGERY    . LUMBAR LAMINECTOMY    . Right leg surgery     from polio age 9  . TENDON GRAFT Right 2009   Hand after Lacertion Injury     Home Medications:  Allergies as of 04/10/2016      Reactions   Duloxetine Hcl    Doesn't remember reaction      Medication List       Accurate as of 04/10/16 11:59 PM. Always use your most recent med list.          amlodipine-benazepril 2.5-10 MG capsule Commonly known as:  LOTREL Take 1 capsule by mouth daily.   aspirin 81 MG tablet Take 81 mg by mouth daily.   atorvastatin 40 MG tablet Commonly known as:  LIPITOR Take 1  tablet (40 mg total) by mouth daily.   finasteride 5 MG tablet Commonly known as:  PROSCAR Take 1 tablet (5 mg total) by mouth daily.   Melatonin 2.5 MG Caps Take 2.5 mg by mouth at bedtime as needed (sleep).   naproxen sodium 220 MG tablet Commonly known as:  ANAPROX Take 220 mg by mouth 2 (two) times daily as needed (pain).   saw palmetto 500 MG capsule Take 450 mg by mouth daily.   vitamin B-12 1000 MCG tablet Commonly known as:  CYANOCOBALAMIN Take 1,000 mcg by mouth daily.   Vitamin D 2000 units Caps Take 1 capsule (2,000 Units total) by mouth daily.       Allergies:  Allergies  Allergen Reactions  . Duloxetine Hcl     Doesn't remember reaction    Family History: Family History  Problem Relation Age of Onset  . Hyperlipidemia Mother   . Heart disease Father   . Heart disease Sister     ATF  . Prostate cancer Neg Hx   . Kidney disease Neg Hx     Social History:  reports that he quit smoking about 23 years ago. His smoking use included Cigarettes. He started smoking about 48 years ago. He has a 37.50 pack-year smoking history. He has never used smokeless tobacco. He reports that he drinks about 4.2 oz of alcohol per week . He reports that he does not use drugs.  ROS: UROLOGY Frequent Urination?: Yes Hard to postpone urination?: Yes Burning/pain with urination?: No Get up at night to urinate?: Yes Leakage of urine?: Yes Urine stream starts and stops?: No Trouble starting stream?: No Do you have to strain to urinate?: No Blood in urine?: No Urinary tract infection?: No Sexually transmitted disease?: No Injury to kidneys or bladder?: No Painful intercourse?: No Weak stream?: No Erection problems?: No Penile pain?: No  Gastrointestinal Nausea?: No Vomiting?: No Indigestion/heartburn?: No Diarrhea?: No Constipation?: No  Constitutional Fever: No Night sweats?: No Weight loss?: No Fatigue?: No  Skin Skin rash/lesions?: No Itching?:  No  Eyes Blurred vision?: No Double vision?: No  Ears/Nose/Throat Sore throat?: No Sinus problems?: No  Hematologic/Lymphatic Swollen glands?: No Easy bruising?: No  Cardiovascular Leg swelling?: No Chest pain?: No  Respiratory Cough?: No Shortness of breath?: No  Endocrine Excessive thirst?: No    Musculoskeletal Back pain?: No Joint pain?: No  Neurological Headaches?: No Dizziness?: No  Psychologic Depression?: No Anxiety?: No  Physical Exam: BP (!) 144/78 (BP Location: Right Arm, Patient Position: Sitting, Cuff Size: Normal)   Pulse 85   Ht 5' 9" (1.753 m)   Wt 214 lb 4.8 oz (97.2 kg)   BMI 31.65 kg/m   Constitutional:  Alert and oriented, No acute distress. HEENT: [place] AT, moist mucus membranes.  Trachea midline, no masses. Cardiovascular: No clubbing, cyanosis, or edema. Respiratory: Normal respiratory effort, no increased work of breathing. GI: Abdomen is soft, nontender, nondistended, no abdominal masses GU: Right hemiscrotal incision healing well, no suspicious skin changes, redness, or drainage. Right hemiscrotum is a large, most consistent with recurrent hydrocele approximately softball sized extending into the inguinal canal. Left hemi-scrotum unremarkable, testicle normal. Rectal: Normal strength or tone. Massively enlarged prostate, no nodules. Nontender. Skin: No rashes, bruises or suspicious lesions. Lymph: No inguinal adenopathy. Neurologic: Grossly intact, no focal deficits, moving all 4 extremities. Psychiatric: Normal mood and affect.  Laboratory Data: Lab Results  Component Value Date   WBC 7.2 04/22/2016   HGB 14.5 04/22/2016   HCT 43.3 04/22/2016   MCV 84.9 04/22/2016   PLT 169 04/22/2016    Lab Results  Component Value Date   CREATININE 0.72 10/04/2015    Lab Results  Component Value Date   HGBA1C 5.0 10/04/2015    Urinalysis    Component Value Date/Time   APPEARANCEUR Clear 11/15/2015 1415   GLUCOSEU Negative  11/15/2015 1415   BILIRUBINUR Negative 11/15/2015 1415   PROTEINUR Negative 11/15/2015 1415   NITRITE Negative 11/15/2015 1415   LEUKOCYTESUR Negative 11/15/2015 1415    Pertinent Imaging: CLINICAL DATA:  RIGHT hydrocele  EXAM: SCROTAL ULTRASOUND  DOPPLER ULTRASOUND OF THE TESTICLES  TECHNIQUE: Complete ultrasound examination of the testicles, epididymis, and other scrotal structures was performed. Color and spectral Doppler ultrasound were also utilized to evaluate blood flow to the testicles.  COMPARISON:  10/25/2015  FINDINGS: Right testicle  Measurements: 5.0 x 2.3 x 3.2 cm. Grossly normal echogenicity without mass or calcification. Displaced within RIGHT scrotum by a large complex RIGHT hydrocele. Internal blood flow present on color Doppler imaging, less than visualized on LEFT  Left testicle  Measurements: 5.2 x 2.6 x 3.4 cm. Normal morphology without mass or calcification. Internal blood flow present on color Doppler imaging.  Right epididymis: Question large complicated cyst at epididymal head containing internal echogenicity, could represent a complex epididymal cyst or spermatocele.  Left epididymis:  Normal in size and appearance.  Hydrocele: Large RIGHT complex RIGHT hydrocele collections measuring 11.9 x 9.1 x 11.6 cm, containing scattered internal echogenicity which is increased since the previous exam, could represent blood or debris ;no significant hyperemia or overlying skin thickening seen to suggest pyoocele though not completely excluded.  Varicocele:  Question LEFT varicocele  Pulsed Doppler interrogation of both testes demonstrates normal low resistance arterial and venous waveforms bilaterally.  IMPRESSION: Very large complex RIGHT hydrocele measuring 11.9 x 9.1 x 11.6 cm, containing diffuse low level internal echogenicity throughout which has increased since the previous exam, could represent protein/debris, hematocele,  unlikely pyocele due to lack of hyperemia but not entirely excluded.  No definite testicular masses identified.  Question LEFT varicocele.   Electronically Signed   By: Mark  Boles M.D.   On: 03/21/2016 17:30  Scrotal she presented today with the patient.  Assessment & Plan:    1. Right hydrocele Recurrent right hydrocele with complex fluid,   likely heme.  Clinically symptomatic.  We discussed various options including continued observation, procedure, and returned to the operating room for redo hydrocelectomy. Explained that there is a risk of recurrent hydrocele, approximately 10% risk or perhaps higher on redo hydrocelectomy given his history of recurrence.  Risk of bleeding, infection, hematoma, pain, damage to surrounding structures, recurrence were all discussed in detail.  - US Art/Ven Flow Abd Pelv Doppler; Future - US Scrotum; Future  2. BPH with lower urinary tract symptoms Continue finasteride  3. Hstory of elevated PSA Recheck PSA today  Rectal exam enlarged, otherwise unremarkable.  Schedule redo right hydrocelectomy  Thermon Zulauf, MD  [facility] [address], [address] , [place] [address] [phone]  

## 2016-04-11 ENCOUNTER — Telehealth: Payer: Self-pay

## 2016-04-11 ENCOUNTER — Encounter: Payer: Self-pay | Admitting: Urology

## 2016-04-11 NOTE — Telephone Encounter (Signed)
Patient was notified that surgery was scheduled 04-29-16 for a Right Hydrocelectomy with Dr. Vanna ScotlandAshley Brandon and pre-admit will call for a phone interview on 04-22-16@9 -1pm. Patient verbalized agreement with this plan.

## 2016-04-20 ENCOUNTER — Encounter: Payer: Self-pay | Admitting: Urology

## 2016-04-20 ENCOUNTER — Encounter: Payer: Self-pay | Admitting: Family Medicine

## 2016-04-22 ENCOUNTER — Inpatient Hospital Stay: Admission: RE | Admit: 2016-04-22 | Payer: Medicare Other | Source: Ambulatory Visit

## 2016-04-22 ENCOUNTER — Encounter: Payer: Self-pay | Admitting: Family Medicine

## 2016-04-22 ENCOUNTER — Encounter
Admission: RE | Admit: 2016-04-22 | Discharge: 2016-04-22 | Disposition: A | Discharge status: Home / Self Care | Payer: Medicare Other | Source: Ambulatory Visit | Attending: Urology | Admitting: Urology

## 2016-04-22 DIAGNOSIS — Z01818 Encounter for other preprocedural examination: Secondary | ICD-10-CM | POA: Diagnosis not present

## 2016-04-22 HISTORY — DX: Acute poliomyelitis, unspecified: A80.9

## 2016-04-22 LAB — CBC
HEMATOCRIT: 43.3 % (ref 40.0–52.0)
HEMOGLOBIN: 14.5 g/dL (ref 13.0–18.0)
MCH: 28.3 pg (ref 26.0–34.0)
MCHC: 33.4 g/dL (ref 32.0–36.0)
MCV: 84.9 fL (ref 80.0–100.0)
Platelets: 169 10*3/uL (ref 150–440)
RBC: 5.1 MIL/uL (ref 4.40–5.90)
RDW: 14 % (ref 11.5–14.5)
WBC: 7.2 10*3/uL (ref 3.8–10.6)

## 2016-04-22 NOTE — Patient Instructions (Signed)
Your procedure is scheduled on: 04/29/16 Mon Report to Same Day Surgery 2nd floor medical mall [facility] Entrance-take elevator on left to 2nd floor.  Check in with surgery information desk.) To find out your arrival time please call [phone] between 1PM - 3PM on 04/26/16 Fri  Remember: Instructions that are not followed completely may result in serious medical risk, up to and including death, or upon the discretion of your surgeon and anesthesiologist your surgery may need to be rescheduled.    _x___ 1. Do not eat food or drink liquids after midnight. No gum chewing or                              hard candies.     __x__ 2. No Alcohol for 24 hours before or after surgery.   __x__3. No Smoking for 24 prior to surgery.   ____  4. Bring all medications with you on the day of surgery if instructed.    __x__ 5. Notify your doctor if there is any change in your medical condition     (cold, fever, infections).     Do not wear jewelry, make-up, hairpins, clips or nail polish.  Do not wear lotions, powders, or perfumes. You may wear deodorant.  Do not shave 48 hours prior to surgery. Men may shave face and neck.  Do not bring valuables to the hospital.    [facility] is not responsible for any belongings or valuables.               Contacts, dentures or bridgework may not be worn into surgery.  Leave your suitcase in the car. After surgery it may be brought to your room.  For patients admitted to the hospital, discharge time is determined by your                       treatment team.   Patients discharged the day of surgery will not be allowed to drive home.  You will need someone to drive you home and stay with you the night of your procedure.    Please read over the following fact sheets that you were given:   [facility] Preparing for Surgery and or MRSA Information   _x___ Take anti-hypertensive (unless it includes a diuretic), cardiac, seizure, asthma,     anti-reflux and  psychiatric medicines. These include:  1. amlodipine-benazepril (LOTREL)   2.atorvastatin (LIPITOR  3.  4.  5.  6.  ____Fleets enema or Magnesium Citrate as directed.   _x___ Use CHG Soap or sage wipes as directed on instruction sheet   ____ Use inhalers on the day of surgery and bring to hospital day of surgery  ____ Stop Metformin and Janumet 2 days prior to surgery.    ____ Take 1/2 of usual insulin dose the night before surgery and none on the morning     surgery.   _x___ Follow recommendations from Cardiologist, Pulmonologist or PCP regarding   stopping Aspirin, Coumadin, Pllavix ,Eliquis, Effient, or Pradaxa, and Pletal.                Stop Aspirin today X____Stop Anti-inflammatories such as Advil, Aleve, Ibuprofen, Motrin, Naproxen, Naprosyn, Goodies powders or aspirin products. OK to take Tylenol and   Celebrex.   _x___ Stop supplements until after surgery.  But may continue Vitamin D, Vitamin B,       and multivitamin.  ____ Bring C-Pap to the hospital.

## 2016-04-28 MED ORDER — CEFAZOLIN SODIUM-DEXTROSE 2-4 GM/100ML-% IV SOLN
2.0000 g | Freq: Once | INTRAVENOUS | Status: AC
  Administered 2016-04-29: 2 g via INTRAVENOUS

## 2016-04-29 ENCOUNTER — Encounter: Payer: Self-pay | Admitting: *Deleted

## 2016-04-29 ENCOUNTER — Ambulatory Visit
Admission: RE | Admit: 2016-04-29 | Discharge: 2016-04-29 | Disposition: A | Discharge status: Home / Self Care | Payer: Medicare Other | Source: Ambulatory Visit | Attending: Urology | Admitting: Urology

## 2016-04-29 ENCOUNTER — Ambulatory Visit: Payer: Medicare Other | Admitting: Anesthesiology

## 2016-04-29 ENCOUNTER — Telehealth: Payer: Self-pay | Admitting: Urology

## 2016-04-29 ENCOUNTER — Encounter
Admission: RE | Disposition: A | Discharge status: Home / Self Care | Payer: Self-pay | Source: Ambulatory Visit | Attending: Urology

## 2016-04-29 DIAGNOSIS — N433 Hydrocele, unspecified: Secondary | ICD-10-CM | POA: Diagnosis not present

## 2016-04-29 DIAGNOSIS — E8881 Metabolic syndrome: Secondary | ICD-10-CM | POA: Diagnosis not present

## 2016-04-29 DIAGNOSIS — Z87891 Personal history of nicotine dependence: Secondary | ICD-10-CM | POA: Diagnosis not present

## 2016-04-29 DIAGNOSIS — N401 Enlarged prostate with lower urinary tract symptoms: Secondary | ICD-10-CM | POA: Insufficient documentation

## 2016-04-29 DIAGNOSIS — G14 Postpolio syndrome: Secondary | ICD-10-CM | POA: Diagnosis not present

## 2016-04-29 DIAGNOSIS — E559 Vitamin D deficiency, unspecified: Secondary | ICD-10-CM | POA: Insufficient documentation

## 2016-04-29 DIAGNOSIS — N281 Cyst of kidney, acquired: Secondary | ICD-10-CM | POA: Diagnosis not present

## 2016-04-29 DIAGNOSIS — Z791 Long term (current) use of non-steroidal anti-inflammatories (NSAID): Secondary | ICD-10-CM | POA: Diagnosis not present

## 2016-04-29 DIAGNOSIS — Z9889 Other specified postprocedural states: Secondary | ICD-10-CM | POA: Insufficient documentation

## 2016-04-29 DIAGNOSIS — G4700 Insomnia, unspecified: Secondary | ICD-10-CM | POA: Insufficient documentation

## 2016-04-29 DIAGNOSIS — Z888 Allergy status to other drugs, medicaments and biological substances status: Secondary | ICD-10-CM | POA: Diagnosis not present

## 2016-04-29 DIAGNOSIS — Z7982 Long term (current) use of aspirin: Secondary | ICD-10-CM | POA: Insufficient documentation

## 2016-04-29 DIAGNOSIS — Z8349 Family history of other endocrine, nutritional and metabolic diseases: Secondary | ICD-10-CM | POA: Diagnosis not present

## 2016-04-29 DIAGNOSIS — R339 Retention of urine, unspecified: Secondary | ICD-10-CM | POA: Diagnosis not present

## 2016-04-29 DIAGNOSIS — I10 Essential (primary) hypertension: Secondary | ICD-10-CM | POA: Diagnosis not present

## 2016-04-29 DIAGNOSIS — Z79899 Other long term (current) drug therapy: Secondary | ICD-10-CM | POA: Diagnosis not present

## 2016-04-29 DIAGNOSIS — E785 Hyperlipidemia, unspecified: Secondary | ICD-10-CM | POA: Insufficient documentation

## 2016-04-29 DIAGNOSIS — Z8249 Family history of ischemic heart disease and other diseases of the circulatory system: Secondary | ICD-10-CM | POA: Insufficient documentation

## 2016-04-29 DIAGNOSIS — N432 Other hydrocele: Secondary | ICD-10-CM

## 2016-04-29 DIAGNOSIS — G4733 Obstructive sleep apnea (adult) (pediatric): Secondary | ICD-10-CM | POA: Insufficient documentation

## 2016-04-29 HISTORY — PX: HYDROCELE EXCISION: SHX482

## 2016-04-29 SURGERY — HYDROCELECTOMY
Anesthesia: General | Site: Scrotum | Laterality: Right | Wound class: Clean Contaminated

## 2016-04-29 MED ORDER — ONDANSETRON HCL 4 MG/2ML IJ SOLN: 4.0000 mg | Freq: Once | INTRAMUSCULAR | Status: DC | PRN

## 2016-04-29 MED ORDER — ONDANSETRON HCL 4 MG/2ML IJ SOLN
INTRAMUSCULAR | Status: AC
  Filled 2016-04-29: qty 2

## 2016-04-29 MED ORDER — LIDOCAINE HCL (CARDIAC) 20 MG/ML IV SOLN
INTRAVENOUS | Status: DC | PRN
  Administered 2016-04-29: 60 mg via INTRAVENOUS

## 2016-04-29 MED ORDER — CEFAZOLIN SODIUM-DEXTROSE 2-4 GM/100ML-% IV SOLN
INTRAVENOUS | Status: AC
  Filled 2016-04-29: qty 100

## 2016-04-29 MED ORDER — PROPOFOL 10 MG/ML IV BOLUS
INTRAVENOUS | Status: DC | PRN
  Administered 2016-04-29: 200 mg via INTRAVENOUS

## 2016-04-29 MED ORDER — FENTANYL CITRATE (PF) 100 MCG/2ML IJ SOLN
INTRAMUSCULAR | Status: AC
  Administered 2016-04-29: 25 ug via INTRAVENOUS
  Filled 2016-04-29: qty 2

## 2016-04-29 MED ORDER — BUPIVACAINE HCL (PF) 0.5 % IJ SOLN
INTRAMUSCULAR | Status: AC
  Filled 2016-04-29: qty 30

## 2016-04-29 MED ORDER — FENTANYL CITRATE (PF) 100 MCG/2ML IJ SOLN
INTRAMUSCULAR | Status: DC | PRN
  Administered 2016-04-29: 25 ug via INTRAVENOUS
  Administered 2016-04-29: 50 ug via INTRAVENOUS
  Administered 2016-04-29: 25 ug via INTRAVENOUS
  Administered 2016-04-29: 50 ug via INTRAVENOUS

## 2016-04-29 MED ORDER — FENTANYL CITRATE (PF) 100 MCG/2ML IJ SOLN
INTRAMUSCULAR | Status: AC
  Filled 2016-04-29: qty 2

## 2016-04-29 MED ORDER — FENTANYL CITRATE (PF) 100 MCG/2ML IJ SOLN
25.0000 ug | INTRAMUSCULAR | Status: AC | PRN
  Administered 2016-04-29 (×6): 25 ug via INTRAVENOUS

## 2016-04-29 MED ORDER — ONDANSETRON HCL 4 MG/2ML IJ SOLN
INTRAMUSCULAR | Status: DC | PRN
  Administered 2016-04-29: 4 mg via INTRAVENOUS

## 2016-04-29 MED ORDER — LACTATED RINGERS IV SOLN
INTRAVENOUS | Status: DC
  Administered 2016-04-29: 12:00:00 via INTRAVENOUS

## 2016-04-29 MED ORDER — BUPIVACAINE HCL 0.5 % IJ SOLN
INTRAMUSCULAR | Status: DC | PRN
  Administered 2016-04-29: 5 mL

## 2016-04-29 MED ORDER — LIDOCAINE HCL (PF) 1 % IJ SOLN
INTRAMUSCULAR | Status: AC
  Filled 2016-04-29: qty 30

## 2016-04-29 MED ORDER — FAMOTIDINE 20 MG PO TABS
ORAL_TABLET | ORAL | Status: AC
  Filled 2016-04-29: qty 1

## 2016-04-29 MED ORDER — HYDROCODONE-ACETAMINOPHEN 5-325 MG PO TABS
1.0000 | ORAL_TABLET | Freq: Four times a day (QID) | ORAL | 0 refills | Status: DC | PRN
Start: 2016-04-29 — End: 2016-05-29

## 2016-04-29 MED ORDER — LIDOCAINE HCL 1 % IJ SOLN
INTRAMUSCULAR | Status: DC | PRN
  Administered 2016-04-29: 5 mL

## 2016-04-29 MED ORDER — PROPOFOL 10 MG/ML IV BOLUS
INTRAVENOUS | Status: AC
  Filled 2016-04-29: qty 20

## 2016-04-29 MED ORDER — FAMOTIDINE 20 MG PO TABS
20.0000 mg | ORAL_TABLET | Freq: Once | ORAL | Status: AC
  Administered 2016-04-29: 20 mg via ORAL

## 2016-04-29 MED ORDER — DOCUSATE SODIUM 100 MG PO CAPS: 100.0000 mg | ORAL_CAPSULE | Freq: Two times a day (BID) | ORAL | 0 refills | Status: DC

## 2016-04-29 MED ORDER — MIDAZOLAM HCL 2 MG/2ML IJ SOLN
INTRAMUSCULAR | Status: DC | PRN
  Administered 2016-04-29: 2 mg via INTRAVENOUS

## 2016-04-29 SURGICAL SUPPLY — 33 items
BLADE CLIPPER SURG (BLADE) ×3 IMPLANT
CANISTER SUCT 1200ML W/VALVE (MISCELLANEOUS) ×3 IMPLANT
CHLORAPREP W/TINT 26ML (MISCELLANEOUS) ×3 IMPLANT
DERMABOND ADVANCED (GAUZE/BANDAGES/DRESSINGS) ×2
DERMABOND ADVANCED .7 DNX12 (GAUZE/BANDAGES/DRESSINGS) ×1 IMPLANT
DRAIN PENROSE 1/4X12 LTX (DRAIN) ×3 IMPLANT
DRAPE LAPAROTOMY 77X122 PED (DRAPES) ×3 IMPLANT
ELECT REM PT RETURN 9FT ADLT (ELECTROSURGICAL) ×3
ELECTRODE REM PT RTRN 9FT ADLT (ELECTROSURGICAL) ×1 IMPLANT
GAUZE FLUFF 18X24 1PLY STRL (GAUZE/BANDAGES/DRESSINGS) ×3 IMPLANT
GAUZE SPONGE 4X4 12PLY STRL (GAUZE/BANDAGES/DRESSINGS) IMPLANT
GLOVE BIO SURGEON STRL SZ 6.5 (GLOVE) ×2 IMPLANT
GLOVE BIO SURGEON STRL SZ7 (GLOVE) ×3 IMPLANT
GLOVE BIO SURGEONS STRL SZ 6.5 (GLOVE) ×1
GOWN STRL REUS W/ TWL LRG LVL3 (GOWN DISPOSABLE) ×2 IMPLANT
GOWN STRL REUS W/TWL LRG LVL3 (GOWN DISPOSABLE) ×4
KIT RM TURNOVER STRD PROC AR (KITS) ×3 IMPLANT
LABEL OR SOLS (LABEL) ×3 IMPLANT
NEEDLE HYPO 25X1 1.5 SAFETY (NEEDLE) ×3 IMPLANT
NS IRRIG 500ML POUR BTL (IV SOLUTION) ×3 IMPLANT
PACK BASIN MINOR ARMC (MISCELLANEOUS) ×3 IMPLANT
SPONGE LAP 18X18 5 PK (GAUZE/BANDAGES/DRESSINGS) ×3 IMPLANT
SUPPORETR ATHLETIC LG (MISCELLANEOUS) ×1 IMPLANT
SUPPORTER ATHLETIC LG (MISCELLANEOUS) ×3
SUT CHROMIC 3 0 SH 27 (SUTURE) ×6 IMPLANT
SUT ETHILON 3-0 FS-10 30 BLK (SUTURE) ×3
SUT ETHILON NAB PS2 4-0 18IN (SUTURE) ×3 IMPLANT
SUT VIC AB 3-0 SH 27 (SUTURE) ×4
SUT VIC AB 3-0 SH 27X BRD (SUTURE) ×2 IMPLANT
SUT VIC AB 4-0 SH 27 (SUTURE)
SUT VIC AB 4-0 SH 27XANBCTRL (SUTURE) IMPLANT
SUTURE EHLN 3-0 FS-10 30 BLK (SUTURE) ×1 IMPLANT
SYRINGE 10CC LL (SYRINGE) ×3 IMPLANT

## 2016-04-29 NOTE — H&P (View-Only) (Signed)
04/10/2016 10:39 AM   Shane Bowen 05-Mar-1945 [id number]  Referring provider: Alba Cory, MD [address] [place], [place] [address]  Chief Complaint  Patient presents with  . Hydrocele    [place] results    HPI: 71 year old male who returns today for Follow-up of a scrotal ultrasound  Right hydrocele Personal history of symptomatic right hydrocele, underwent hydrocelectomy on 01/2016 at which time 650 cc of clear fluid was drained from the hydrocele sac. He initially had dramatic improvement in his scrotal size but around the 4 week mark, had recurrence of the fluid collection in his right hemiscrotum.  Follow-up scrotal ultrasound on 03/21/2016 shows reaccumulation of a complex right hydrocele measuring 1.9 x 9.1 x 11.6 cm.    He continues to be asymptomatic and bothered by the size of his hemiscrotum. He is interested in redo hydrocelectomy.  BPH with LUTS Previously on finasteride for massive BPH.  He stopped briefly for a few months and resumed 3 months ago.  Symptoms improving over past months since resuming.  IPSS as below 8/2.   History of gross hematuria CT 11/10/2015 benign, prostate about 115 grams. Most recent PSA 4.2 on 10/11/15, recheck today.      IPSS    Row Name 04/10/16 1600         International Prostate Symptom Score   How often have you had the sensation of not emptying your bladder? Less than 1 in 5     How often have you had to urinate less than every two hours? About half the time     How often have you found you stopped and started again several times when you urinated? Less than half the time     How often have you found it difficult to postpone urination? Less than 1 in 5 times     How often have you had a weak urinary stream? Less than 1 in 5 times     How often have you had to strain to start urination? Not at All     How many times did you typically get up at night to urinate? None     Total IPSS Score 8       Quality of  Life due to urinary symptoms   If you were to spend the rest of your life with your urinary condition just the way it is now how would you feel about that? Mostly Satisfied        Score:  1-7 Mild 8-19 Moderate 20-35 Severe    PMH: Past Medical History:  Diagnosis Date  . Abnormal ECG   . Androgen deficiency   . Anemia   . Bilateral renal cysts   . BPH with elevated PSA   . Chronic insomnia   . Epididymal cyst   . Erectile dysfunction   . Gross hematuria   . Hepatitis    age 38 hepatitis A  . Hydrocele, right   . Hyperglycemia   . Hyperlipidemia   . Hypertension   . Incomplete bladder emptying   . Insomnia   . Lumbago   . Metabolic syndrome   . Obstructive sleep apnea    CPAP  . Over weight   . Polio   . Post-polio syndrome   . Prostate enlargement   . Scrotal pain   . Vitamin D deficiency     Surgical History: Past Surgical History:  Procedure Laterality Date  . ANKLE FUSION Right 05/2009  . COLONOSCOPY    .  COLONOSCOPY WITH PROPOFOL N/A 04/27/2015   Procedure: COLONOSCOPY WITH PROPOFOL;  Surgeon: Midge Minium, MD;  Location: [facility];  Service: Endoscopy;  Laterality: N/A;  CPAP  . HYDROCELE EXCISION Right 01/29/2016   Procedure: HYDROCELECTOMY ADULT;  Surgeon: Vanna Scotland, MD;  Location: [facility];  Service: Urology;  Laterality: Right;  . INGUINAL HERNIA REPAIR    . LEG SURGERY    . LUMBAR LAMINECTOMY    . Right leg surgery     from polio age 82  . TENDON GRAFT Right 2009   Hand after Lacertion Injury     Home Medications:  Allergies as of 04/10/2016      Reactions   Duloxetine Hcl    Doesn't remember reaction      Medication List       Accurate as of 04/10/16 11:59 PM. Always use your most recent med list.          amlodipine-benazepril 2.5-10 MG capsule Commonly known as:  LOTREL Take 1 capsule by mouth daily.   aspirin 81 MG tablet Take 81 mg by mouth daily.   atorvastatin 40 MG tablet Commonly known as:  LIPITOR Take 1  tablet (40 mg total) by mouth daily.   finasteride 5 MG tablet Commonly known as:  PROSCAR Take 1 tablet (5 mg total) by mouth daily.   Melatonin 2.5 MG Caps Take 2.5 mg by mouth at bedtime as needed (sleep).   naproxen sodium 220 MG tablet Commonly known as:  ANAPROX Take 220 mg by mouth 2 (two) times daily as needed (pain).   saw palmetto 500 MG capsule Take 450 mg by mouth daily.   vitamin B-12 1000 MCG tablet Commonly known as:  CYANOCOBALAMIN Take 1,000 mcg by mouth daily.   Vitamin D 2000 units Caps Take 1 capsule (2,000 Units total) by mouth daily.       Allergies:  Allergies  Allergen Reactions  . Duloxetine Hcl     Doesn't remember reaction    Family History: Family History  Problem Relation Age of Onset  . Hyperlipidemia Mother   . Heart disease Father   . Heart disease Sister     ATF  . Prostate cancer Neg Hx   . Kidney disease Neg Hx     Social History:  reports that he quit smoking about 23 years ago. His smoking use included Cigarettes. He started smoking about 48 years ago. He has a 37.50 pack-year smoking history. He has never used smokeless tobacco. He reports that he drinks about 4.2 oz of alcohol per week . He reports that he does not use drugs.  ROS: UROLOGY Frequent Urination?: Yes Hard to postpone urination?: Yes Burning/pain with urination?: No Get up at night to urinate?: Yes Leakage of urine?: Yes Urine stream starts and stops?: No Trouble starting stream?: No Do you have to strain to urinate?: No Blood in urine?: No Urinary tract infection?: No Sexually transmitted disease?: No Injury to kidneys or bladder?: No Painful intercourse?: No Weak stream?: No Erection problems?: No Penile pain?: No  Gastrointestinal Nausea?: No Vomiting?: No Indigestion/heartburn?: No Diarrhea?: No Constipation?: No  Constitutional Fever: No Night sweats?: No Weight loss?: No Fatigue?: No  Skin Skin rash/lesions?: No Itching?:  No  Eyes Blurred vision?: No Double vision?: No  Ears/Nose/Throat Sore throat?: No Sinus problems?: No  Hematologic/Lymphatic Swollen glands?: No Easy bruising?: No  Cardiovascular Leg swelling?: No Chest pain?: No  Respiratory Cough?: No Shortness of breath?: No  Endocrine Excessive thirst?: No  Musculoskeletal Back pain?: No Joint pain?: No  Neurological Headaches?: No Dizziness?: No  Psychologic Depression?: No Anxiety?: No  Physical Exam: BP (!) 144/78 (BP Location: Right Arm, Patient Position: Sitting, Cuff Size: Normal)   Pulse 85   Ht  (1.753 m)   Wt 214 lb 4.8 oz (97.2 kg)   BMI 31.65 kg/m   Constitutional:  Alert and oriented, No acute distress. HEENT: [place] AT, moist mucus membranes.  Trachea midline, no masses. Cardiovascular: No clubbing, cyanosis, or edema. Respiratory: Normal respiratory effort, no increased work of breathing. GI: Abdomen is soft, nontender, nondistended, no abdominal masses GU: Right hemiscrotal incision healing well, no suspicious skin changes, redness, or drainage. Right hemiscrotum is a large, most consistent with recurrent hydrocele approximately softball sized extending into the inguinal canal. Left hemi-scrotum unremarkable, testicle normal. Rectal: Normal strength or tone. Massively enlarged prostate, no nodules. Nontender. Skin: No rashes, bruises or suspicious lesions. Lymph: No inguinal adenopathy. Neurologic: Grossly intact, no focal deficits, moving all 4 extremities. Psychiatric: Normal mood and affect.  Laboratory Data: Lab Results  Component Value Date   WBC 7.2 04/22/2016   HGB 14.5 04/22/2016   HCT 43.3 04/22/2016   MCV 84.9 04/22/2016   PLT 169 04/22/2016    Lab Results  Component Value Date   CREATININE 0.72 10/04/2015    Lab Results  Component Value Date   HGBA1C 5.0 10/04/2015    Urinalysis    Component Value Date/Time   APPEARANCEUR Clear 11/15/2015 1415   GLUCOSEU Negative  11/15/2015 1415   BILIRUBINUR Negative 11/15/2015 1415   PROTEINUR Negative 11/15/2015 1415   NITRITE Negative 11/15/2015 1415   LEUKOCYTESUR Negative 11/15/2015 1415    Pertinent Imaging: CLINICAL DATA:  RIGHT hydrocele  EXAM: SCROTAL ULTRASOUND  DOPPLER ULTRASOUND OF THE TESTICLES  TECHNIQUE: Complete ultrasound examination of the testicles, epididymis, and other scrotal structures was performed. Color and spectral Doppler ultrasound were also utilized to evaluate blood flow to the testicles.  COMPARISON:  10/25/2015  FINDINGS: Right testicle  Measurements: 5.0 x 2.3 x 3.2 cm. Grossly normal echogenicity without mass or calcification. Displaced within RIGHT scrotum by a large complex RIGHT hydrocele. Internal blood flow present on color Doppler imaging, less than visualized on LEFT  Left testicle  Measurements: 5.2 x 2.6 x 3.4 cm. Normal morphology without mass or calcification. Internal blood flow present on color Doppler imaging.  Right epididymis: Question large complicated cyst at epididymal head containing internal echogenicity, could represent a complex epididymal cyst or spermatocele.  Left epididymis:  Normal in size and appearance.  Hydrocele: Large RIGHT complex RIGHT hydrocele collections measuring 11.9 x 9.1 x 11.6 cm, containing scattered internal echogenicity which is increased since the previous exam, could represent blood or debris ;no significant hyperemia or overlying skin thickening seen to suggest pyoocele though not completely excluded.  Varicocele:  Question LEFT varicocele  Pulsed Doppler interrogation of both testes demonstrates normal low resistance arterial and venous waveforms bilaterally.  IMPRESSION: Very large complex RIGHT hydrocele measuring 11.9 x 9.1 x 11.6 cm, containing diffuse low level internal echogenicity throughout which has increased since the previous exam, could represent protein/debris, hematocele,  unlikely pyocele due to lack of hyperemia but not entirely excluded.  No definite testicular masses identified.  Question LEFT varicocele.   Electronically Signed   By: Ulyses Southward M.D.   On: 03/21/2016 17:30  Scrotal she presented today with the patient.  Assessment & Plan:    1. Right hydrocele Recurrent right hydrocele with complex fluid,  likely heme.  Clinically symptomatic.  We discussed various options including continued observation, procedure, and returned to the operating room for redo hydrocelectomy. Explained that there is a risk of recurrent hydrocele, approximately 10% risk or perhaps higher on redo hydrocelectomy given his history of recurrence.  Risk of bleeding, infection, hematoma, pain, damage to surrounding structures, recurrence were all discussed in detail.  - [place] Art/Ven Flow Abd Pelv Doppler; Future - US Scrotum; Future  2. BPH with lower urinary tract symptoms Continue finasteride  3. Hstory of elevated PSA Recheck PSA today  Rectal exam enlarged, otherwise unremarkable.  Schedule redo right hydrocelectomy  Vanna Scotland, MD  [facility] [address] [place], [place] [address] [phone]

## 2016-04-29 NOTE — Interval H&P Note (Signed)
History and Physical Interval Note:  04/29/2016 1:21 PM  Shane Bowen  has presented today for surgery, with the diagnosis of HYDROCELE RIGHT  The various methods of treatment have been discussed with the patient and family. After consideration of risks, benefits and other options for treatment, the patient has consented to  Procedure(s): HYDROCELECTOMY ADULT (Right) as a surgical intervention .  The patient's history has been reviewed, patient examined, no change in status, stable for surgery.  I have reviewed the patient's chart and labs.  Questions were answered to the patient's satisfaction.    RRR CTAB   Vanna Scotland

## 2016-04-29 NOTE — Discharge Instructions (Signed)
General Anesthesia, Adult, Care After These instructions provide you with information about caring for yourself after your procedure. Your health care provider may also give you more specific instructions. Your treatment has been planned according to current medical practices, but problems sometimes occur. Call your health care provider if you have any problems or questions after your procedure. What can I expect after the procedure? After the procedure, it is common to have:  Vomiting.  A sore throat.  Mental slowness. It is common to feel:  Nauseous.  Cold or shivery.  Sleepy.  Tired.  Sore or achy, even in parts of your body where you did not have surgery. Follow these instructions at home: For at least 24 hours after the procedure:   Do not:  Participate in activities where you could fall or become injured.  Drive.  Use heavy machinery.  Drink alcohol.  Take sleeping pills or medicines that cause drowsiness.  Make important decisions or sign legal documents.  Take care of children on your own.  Rest. Eating and drinking   If you vomit, drink water, juice, or soup when you can drink without vomiting.  Drink enough fluid to keep your urine clear or pale yellow.  Make sure you have little or no nausea before eating solid foods.  Follow the diet recommended by your health care provider. General instructions   Have a responsible adult stay with you until you are awake and alert.  Return to your normal activities as told by your health care provider. Ask your health care provider what activities are safe for you.  Take over-the-counter and prescription medicines only as told by your health care provider.  If you smoke, do not smoke without supervision.  Keep all follow-up visits as told by your health care provider. This is important. Contact a health care provider if:  You continue to have nausea or vomiting at home, and medicines are not helpful.  You  cannot drink fluids or start eating again.  You cannot urinate after 8-12 hours.  You develop a skin rash.  You have fever.  You have increasing redness at the site of your procedure. Get help right away if:  You have difficulty breathing.  You have chest pain.  You have unexpected bleeding.  You feel that you are having a life-threatening or urgent problem. This information is not intended to replace advice given to you by your health care provider. Make sure you discuss any questions you have with your health care provider. Document Released: 04/15/2000 Document Revised: 06/12/2015 Document Reviewed: 12/22/2014 Elsevier Interactive Patient Education  2017 [employer]   Hydrocelectomy, Adult, Care After This sheet gives you information about how to care for yourself after your procedure. Your health care provider may also give you more specific instructions. If you have problems or questions, contact your health care provider. What can I expect after the procedure? After your procedure, it is common to have mild discomfort, swelling, and bruising in the pouch that holds your testicles (scrotum). Follow these instructions at home: Bathing   Ask your health care provider when you can shower, take baths, or go swimming.  If you were told to wear an athletic support strap, take it off when you shower or take a bath. Incision care   Follow instructions from your health care provider about how to take care of your incision. Make sure you:  Wash your hands with soap and water before you change your bandage (dressing). If soap and water  are not available, use hand sanitizer.  Change your dressing as told by your health care provider.  Leave stitches (sutures) in place.  Check your incision and scrotum every day for signs of infection. Check for:  More redness, swelling, or pain.  Blood or fluid.  Warmth.  Pus or a bad smell. Managing pain, stiffness, and swelling    If directed, apply ice to the injured area:  Put ice in a plastic bag.  Place a towel between your skin and the bag.  Leave the ice on for 20 minutes, 2-3 times per day. Driving   Do not drive for 24 hours if you were given a sedative.  Do not drive or use heavy machinery while taking prescription pain medicine.  Ask your health care provider when it is safe to drive. Activity   Do not do any activities that require great strength and energy (are vigorous) for as long as told by your health care provider.  Return to your normal activities as told by your health care provider. Ask your health care provider what activities are safe for you.  Do not lift anything that is heavier than 10 lb (4.5 kg) until your health care provider says that it is safe. General instructions   Take over-the-counter and prescription medicines only as told by your health care provider.  Keep all follow-up visits as told by your health care provider. This is important.  If you were given an athletic support strap, wear it as told by your health care provider.  If you had a drain put in during the procedure, you will need to return for a follow-up visit to have it removed. Contact a health care provider if:  Your pain is not controlled with medicine.  You have more redness or swelling around your scrotum.  You have blood or fluid coming from your scrotum.  Your incision feels warm to the touch.  You have pus or a bad smell coming from your scrotum.  You have a fever. This information is not intended to replace advice given to you by your health care provider. Make sure you discuss any questions you have with your health care provider. Document Released: 09/28/2014 Document Revised: 10/07/2015 Document Reviewed: 10/07/2015 Elsevier Interactive Patient Education  2017 [employer].

## 2016-04-29 NOTE — Anesthesia Procedure Notes (Signed)
Procedure Name: LMA Insertion Performed by: Justn Quale Pre-anesthesia Checklist: Patient identified, Patient being monitored, Timeout performed, Emergency Drugs available and Suction available Patient Re-evaluated:Patient Re-evaluated prior to inductionOxygen Delivery Method: Circle system utilized Preoxygenation: Pre-oxygenation with 100% oxygen Intubation Type: IV induction LMA: LMA inserted LMA Size: 4.5 Tube type: Oral Number of attempts: 1 Placement Confirmation: positive ETCO2 and breath sounds checked- equal and bilateral Tube secured with: Tape Dental Injury: Teeth and Oropharynx as per pre-operative assessment        

## 2016-04-29 NOTE — Transfer of Care (Signed)
Immediate Anesthesia Transfer of Care Note  Patient: Shane Bowen  Procedure(s) Performed: Procedure(s): HYDROCELECTOMY ADULT (Right)  Patient Location: PACU  Anesthesia Type:General  Level of Consciousness: sedated  Airway & Oxygen Therapy: Patient Spontanous Breathing and Patient connected to face mask oxygen  Post-op Assessment: Report given to RN and Post -op Vital signs reviewed and stable  Post vital signs: Reviewed and stable  Last Vitals:  Vitals:   04/29/16 1121 04/29/16 1511  BP: (!) 149/82 123/77  Pulse: 82 75  Resp: 18 20  Temp: 36.7 C     Last Pain:  Vitals:   04/29/16 1121  TempSrc: Oral         Complications: No apparent anesthesia complications

## 2016-04-29 NOTE — Op Note (Signed)
Date of procedure: 04/29/16  Preoperative diagnosis:  1. Right recurrent hydrocele   Postoperative diagnosis:  1. Right recurrent hydrocele   Procedure: 1. Right hydrocelectomy  Surgeon: Vanna Scotland, MD  Anesthesia: General  Complications: None  Intraoperative findings: 500 cc of brownish bloody fluid drained from recurrent right hydrocele sac, thickened scrotal wall and hydrocele sac.  EBL: Minimal  Specimens: Right hydrocele sac  Drains: Penrose drain  Indication: Shane Bowen is a 71 y.o. patient with recurrent right hydrocele following hydrocelectomy in 01/2016. He returns today for redo surgery.  After reviewing the management options for treatment, he elected to proceed with the above surgical procedure(s). We have discussed the potential benefits and risks of the procedure, side effects of the proposed treatment, the likelihood of the patient achieving the goals of the procedure, and any potential problems that might occur during the procedure or recuperation. Informed consent has been obtained.  Description of procedure:  The patient was taken to the operating room and general anesthesia was induced.  The patient was placed in the supine position, prepped and draped in the usual sterile fashion, and preoperative antibiotics were administered. A preoperative time-out was performed.   10 cc of a solution of 1% lidocaine and half percent Marcaine was instilled into the right hemiscrotum in a transverse fashion over his previous incision site. A proximally 10 cm incision was then created over his previous right hemiscrotal incision. The incision was carried down to the dartos using Bovie electrocautery. This was fairly significantly adherent and difficult to dissect out the sac. A small violation of the hydrocele sac was performed at this time which was oversewn in a figure of 8 using Vicryl to maintain the fluid within the hydrocele sac in order to facilitate dissection.  Eventually,  the hydrocele was able to be delivered through the incision into the field.  At this point in time, an incision was created on the anterior surface of the hydrocele sac at which time 500 cc of brownish bloody fluid was drained from the sac.  The edges of the large sac were then carefully removed using Bovie electrocautery. These were thickened and inflamed in appearance. Care taken to avoid any injury to the cord structures, the testicle, and the epididymis. The sac was blind ending. The edges were carefully fulgurated using Bovie electrocautery. There is excellent hemostasis. I was unable to either at the arches as a sufficient amount of hydrocele sac was removed and there is concern for cord strangulation.  Careful hemostasis was then achieved within the right hemiscrotal sac using Bovie electrocautery. The wound was carefully irrigated. A small stab incision was created in the deep ended portion of the scrotum through which a Penrose drain was snaked and laid in the bed of the wound. The right testicle was then returned back into the right hemiscrotum in the appropriate anatomic position. The drain was secured using a drain stitch. The wound was then closed using a running 3-0 Vicryl suture to close the dartos followed by 4-0 interrupted chromic to close the scrotal skin. Additional layer of Dermabond was applied. Scrotal fluffs and a scrotal support device were applied. He was then first from anesthesia, and taken to PACU in stable condition.    Plan: Patient will return in 3 days for drain removal and wound check in 4 week.    Vanna Scotland, M.D.

## 2016-04-29 NOTE — Anesthesia Post-op Follow-up Note (Cosign Needed)
Anesthesia QCDR form completed.        

## 2016-04-29 NOTE — Anesthesia Postprocedure Evaluation (Signed)
Anesthesia Post Note  Patient: Shane Bowen  Procedure(s) Performed: Procedure(s) (LRB): HYDROCELECTOMY ADULT (Right)  Patient location during evaluation: PACU Anesthesia Type: General Level of consciousness: awake and alert Pain management: pain level controlled Vital Signs Assessment: post-procedure vital signs reviewed and stable Respiratory status: spontaneous breathing and respiratory function stable Cardiovascular status: stable Anesthetic complications: no     Last Vitals:  Vitals:   04/29/16 1121 04/29/16 1511  BP: (!) 149/82 123/77  Pulse: 82 75  Resp: 18 20  Temp: 36.7 C 36.5 C    Last Pain:  Vitals:   04/29/16 1511  TempSrc:   PainSc: Asleep                 KEPHART,WILLIAM K

## 2016-04-29 NOTE — Telephone Encounter (Signed)
done

## 2016-04-29 NOTE — Telephone Encounter (Signed)
-----   Message from Vanna Scotland, MD sent at 04/29/2016  3:15 PM EDT ----- Regarding: f/u plan drain removal nurse visit or PA in 3 days in addition to post op with me in 4 weeks  Vanna Scotland, MD

## 2016-04-29 NOTE — Anesthesia Preprocedure Evaluation (Signed)
Anesthesia Evaluation  Patient identified by MRN, date of birth, ID band Patient awake    Reviewed: Allergy & Precautions, NPO status , Patient's Chart, lab work & pertinent test results  History of Anesthesia Complications Negative for: history of anesthetic complications  Airway Mallampati: II       Dental   Pulmonary sleep apnea and Continuous Positive Airway Pressure Ventilation , former smoker,           Cardiovascular hypertension, Pt. on medications      Neuro/Psych negative neurological ROS     GI/Hepatic (+) Hepatitis -, B  Endo/Other    Renal/GU Renal disease (stones)     Musculoskeletal   Abdominal   Peds  Hematology  (+) anemia ,   Anesthesia Other Findings   Reproductive/Obstetrics                             Anesthesia Physical Anesthesia Plan  ASA: II  Anesthesia Plan: General   Post-op Pain Management:    Induction: Intravenous  Airway Management Planned: LMA  Additional Equipment:   Intra-op Plan:   Post-operative Plan:   Informed Consent: I have reviewed the patients History and Physical, chart, labs and discussed the procedure including the risks, benefits and alternatives for the proposed anesthesia with the patient or authorized representative who has indicated his/her understanding and acceptance.     Plan Discussed with:   Anesthesia Plan Comments:         Anesthesia Quick Evaluation

## 2016-04-30 ENCOUNTER — Encounter: Payer: Self-pay | Admitting: Urology

## 2016-05-01 LAB — SURGICAL PATHOLOGY

## 2016-05-02 ENCOUNTER — Ambulatory Visit: Payer: Medicare Other | Admitting: Urology

## 2016-05-02 ENCOUNTER — Ambulatory Visit: Payer: Medicare Other

## 2016-05-02 DIAGNOSIS — N433 Hydrocele, unspecified: Secondary | ICD-10-CM

## 2016-05-02 NOTE — Progress Notes (Signed)
Pt presents today for drain removal post surgery. Pt tolerated well. No s/s of adverse reaction noted. Reinforced with pt post care. Pt voiced understanding.

## 2016-05-03 ENCOUNTER — Other Ambulatory Visit: Payer: Self-pay | Admitting: Family Medicine

## 2016-05-03 DIAGNOSIS — I10 Essential (primary) hypertension: Secondary | ICD-10-CM

## 2016-05-03 MED ORDER — AMLODIPINE BESY-BENAZEPRIL HCL 2.5-10 MG PO CAPS: 1.0000 | ORAL_CAPSULE | Freq: Every day | ORAL | 0 refills | Status: DC

## 2016-05-03 MED ORDER — AMLODIPINE BESY-BENAZEPRIL HCL 2.5-10 MG PO CAPS: 1.0000 | ORAL_CAPSULE | Freq: Every day | ORAL | 1 refills | Status: DC

## 2016-05-17 ENCOUNTER — Encounter: Payer: Self-pay | Admitting: Family Medicine

## 2016-05-17 ENCOUNTER — Ambulatory Visit (INDEPENDENT_AMBULATORY_CARE_PROVIDER_SITE_OTHER): Payer: Medicare Other | Admitting: Family Medicine

## 2016-05-17 ENCOUNTER — Other Ambulatory Visit: Payer: Self-pay | Admitting: Family Medicine

## 2016-05-17 VITALS — BP 144/74 | HR 99 | Temp 98.8°F | Resp 16 | Ht 69.0 in | Wt 212.5 lb

## 2016-05-17 DIAGNOSIS — K5900 Constipation, unspecified: Secondary | ICD-10-CM | POA: Diagnosis not present

## 2016-05-17 DIAGNOSIS — K921 Melena: Secondary | ICD-10-CM

## 2016-05-17 DIAGNOSIS — K648 Other hemorrhoids: Secondary | ICD-10-CM

## 2016-05-17 DIAGNOSIS — K5909 Other constipation: Secondary | ICD-10-CM

## 2016-05-17 MED ORDER — HYDROCORTISONE ACETATE 25 MG RE SUPP: 25.0000 mg | Freq: Two times a day (BID) | RECTAL | 0 refills | Status: DC

## 2016-05-17 MED ORDER — PREDNISONE 20 MG PO TABS
20.0000 mg | ORAL_TABLET | Freq: Every day | ORAL | 0 refills | Status: DC
Start: 2016-05-17 — End: 2016-05-17

## 2016-05-17 MED ORDER — POLYETHYLENE GLYCOL 3350 17 GM/SCOOP PO POWD: 17.0000 g | Freq: Two times a day (BID) | ORAL | 1 refills | Status: DC | PRN

## 2016-05-17 NOTE — Progress Notes (Signed)
Name: Shane Bowen   MRN: [id number]    DOB: 27-Jul-1945   Date:05/17/2016       Progress Note  Subjective  Chief Complaint  Chief Complaint  Patient presents with  . Rectal Pain    pt states that he thinks it is a rectal prolapse symptoms started last night  . Rectal Bleeding    HPI  Hemorrhoids: he states that he was travelling and did not have a bowel movement for 4 days, so he got home used an enema and straining for 90 minutes, when he wiped he noticed blood in the toilet bowel and a mass on his anus, he states never painful, but uncomfortable because of the bulging sensation. He is not constipated on a regular basis but it happens occasionally. No fever or chills. He had a colonoscopy last year that showed grade II hemorrhoids and diverticulosis.    Patient Active Problem List   Diagnosis Date Noted  . Special screening for malignant neoplasms, colon   . Other hydrocele 04/17/2015  . History of elevated PSA 04/17/2015  . Change in bowel movement 03/30/2015  . Elevated PSA 10/17/2014  . History of hematuria 10/17/2014  . Erectile dysfunction of organic origin 10/17/2014  . History of poliomyelitis 09/30/2014  . Gait instability 09/30/2014  . 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 Umbilical hernia without obstruction or gangrene 09/30/2014  . Kidney cysts 07/28/2014  . BPH (benign prostatic hyperplasia) 07/28/2014  . Insomnia, persistent 07/28/2014  . Dyslipidemia 07/28/2014  . Impotence of organic origin 07/28/2014  . Essential (primary) hypertension 07/28/2014  . Blood glucose elevated 07/28/2014  . Male hypogonadism 07/28/2014  . Obesity (BMI 30-39.9) 07/28/2014  . Obstructive apnea 07/28/2014  . Spermatocele 07/28/2014  . Vitamin D deficiency 11/10/2008    Past Surgical History:  Procedure Laterality Date  . ANKLE FUSION Right 05/2009  . COLONOSCOPY    . COLONOSCOPY WITH PROPOFOL N/A 04/27/2015   Procedure: COLONOSCOPY WITH PROPOFOL;  Surgeon: Midge Minium, MD;   Location: [facility];  Service: Endoscopy;  Laterality: N/A;  CPAP  . HYDROCELE EXCISION Right 01/29/2016   Procedure: HYDROCELECTOMY ADULT;  Surgeon: Vanna Scotland, MD;  Location: [facility];  Service: Urology;  Laterality: Right;  . HYDROCELE EXCISION Right 04/29/2016   Procedure: HYDROCELECTOMY ADULT;  Surgeon: Vanna Scotland, MD;  Location: [facility];  Service: Urology;  Laterality: Right;  . INGUINAL HERNIA REPAIR    . LEG SURGERY    . LUMBAR LAMINECTOMY    . Right leg surgery     from polio age 71  . TENDON GRAFT Right 2009   Hand after Lacertion Injury     Family History  Problem Relation Age of Onset  . Hyperlipidemia Mother   . Heart disease Father   . Heart disease Sister     ATF  . Prostate cancer Neg Hx   . Kidney disease Neg Hx     Social History   Social History  . Marital status: Widowed    Spouse name: N/A  . Number of children: N/A  . Years of education: N/A   Occupational History  . Not on file.   Social History Main Topics  . Smoking status: Former Smoker    Packs/day: 1.50    Years: 25.00    Types: Cigarettes    Start date: 01/22/1968    Quit date: 01/21/1993  . Smokeless tobacco: Never Used  . Alcohol use 4.2 oz/week    7 Glasses of wine per week  Comment:    . Drug use: No  . Sexual activity: Not Currently   Other Topics Concern  . Not on file   Social History Narrative  . No narrative on file     Current Outpatient Prescriptions:  .  amlodipine-benazepril (LOTREL) 2.5-10 MG capsule, Take 1 capsule by mouth daily., Disp: 90 capsule, Rfl: 1 .  aspirin 81 MG tablet, Take 81 mg by mouth daily. , Disp: , Rfl:  .  atorvastatin (LIPITOR) 40 MG tablet, Take 1 tablet (40 mg total) by mouth daily., Disp: 90 tablet, Rfl: 1 .  Cholecalciferol (VITAMIN D) 2000 units CAPS, Take 1 capsule (2,000 Units total) by mouth daily., Disp: 30 capsule, Rfl: 0 .  docusate sodium (COLACE) 100 MG capsule, Take 100 mg by mouth 2 (two) times daily as needed for  mild constipation., Disp: , Rfl:  .  docusate sodium (COLACE) 100 MG capsule, Take 1 capsule (100 mg total) by mouth 2 (two) times daily., Disp: 60 capsule, Rfl: 0 .  finasteride (PROSCAR) 5 MG tablet, Take 1 tablet (5 mg total) by mouth daily., Disp: 30 tablet, Rfl: 6 .  HYDROcodone-acetaminophen (NORCO/VICODIN) 5-325 MG tablet, Take 1-2 tablets by mouth every 6 (six) hours as needed for moderate pain., Disp: 10 tablet, Rfl: 0 .  Melatonin 2.5 MG CAPS, Take 2.5 mg by mouth at bedtime as needed (sleep)., Disp: , Rfl:  .  naproxen sodium (ANAPROX) 220 MG tablet, Take 220 mg by mouth 2 (two) times daily as needed (pain)., Disp: , Rfl:  .  saw palmetto 500 MG capsule, Take 450 mg by mouth daily. , Disp: , Rfl:  .  vitamin B-12 (CYANOCOBALAMIN) 1000 MCG tablet, Take 1,000 mcg by mouth daily., Disp: , Rfl:   Allergies  Allergen Reactions  . Duloxetine Hcl     Doesn't remember reaction     ROS  Ten systems reviewed and is negative except as mentioned in HPI   Objective  Vitals:   05/17/16 1126  BP: (!) 144/74  Pulse: 99  Resp: 16  Temp: 98.8 F (37.1 C)  SpO2: 93%  Weight: 212 lb 8 oz (96.4 kg)  Height:  (1.753 m)    Body mass index is 31.38 kg/m.  Physical Exam  Constitutional: Patient appears well-developed and well-nourished. Obese  No distress.  HEENT: head atraumatic, normocephalic, pupils equal and reactive to light,  neck supple, throat within normal limits Cardiovascular: Normal rate, regular rhythm and normal heart sounds.  No murmur heard. No BLE edema. Pulmonary/Chest: Effort normal and breath sounds normal. No respiratory distress. Abdominal: Soft.  There is no tenderness. Hemorrhoids bulging and inflamed, no thrombosis Psychiatric: Patient has a normal mood and affect. behavior is normal. Judgment and thought content normal.  Recent Results (from the past 2160 hour(s))  PSA     Status: None   Collection Time: 04/08/16 10:24 AM  Result Value Ref Range    Prostate Specific Ag, Serum 2.4 0.0 - 4.0 ng/mL    Comment: Roche ECLIA methodology. According to the American Urological Association, Serum PSA should decrease and remain at undetectable levels after radical prostatectomy. The AUA defines biochemical recurrence as an initial PSA value 0.2 ng/mL or greater followed by a subsequent confirmatory PSA value 0.2 ng/mL or greater. Values obtained with different assay methods or kits cannot be used interchangeably. Results cannot be interpreted as absolute evidence of the presence or absence of malignant disease.   CBC     Status: None   Collection  Time: 04/22/16  1:39 PM  Result Value Ref Range   WBC 7.2 3.8 - 10.6 K/uL   RBC 5.10 4.40 - 5.90 MIL/uL   Hemoglobin 14.5 13.0 - 18.0 g/dL   HCT 95.6 21.3 - 08.6 %   MCV 84.9 80.0 - 100.0 fL   MCH 28.3 26.0 - 34.0 pg   MCHC 33.4 32.0 - 36.0 g/dL   RDW 57.8 46.9 - 62.9 %   Platelets 169 150 - 440 K/uL  Surgical pathology     Status: None   Collection Time: 04/29/16  2:17 PM  Result Value Ref Range   SURGICAL PATHOLOGY      Surgical Pathology CASE: [id number] PATIENT: [name] Surgical Pathology Report     SPECIMEN SUBMITTED: A. Hydrocele  CLINICAL HISTORY: None provided  PRE-OPERATIVE DIAGNOSIS: Hydrocele right  POST-OPERATIVE DIAGNOSIS: Same as pre-op     DIAGNOSIS: A. HYDROCELE, RIGHT; HYDROCELECTOMY: - HYDROCELE SAC WITH CHRONIC INFLAMMATION.   GROSS DESCRIPTION: A. Labeled: hydrocele Tissue fragment(s): 3 Size: aggregate 11.2 x 7.5 x 0.8 cm Description:  shaggy tan purple fibrous tissue  Representative submitted in one cassette(s).  Final Diagnosis performed by Ronald Lobo, MD.  Electronically signed 05/01/2016 6:33:56PM    The electronic signature indicates that the named Attending Pathologist has evaluated the specimen  Technical component performed at [facility], [address] [place], [place] [address] Lab: [phone] Dir: Titus Dubin. Cato Mulligan,  MD  Professional component performed at [facility], [address] [place], [place], [place] [address] Lab: [phone] Dir: Georgiann Cocker. Rubinas, MD        PHQ2/9: Depression screen [facility] 2/9 04/01/2016 10/03/2015 03/30/2015 09/30/2014 07/29/2014  Decreased Interest 0 0 0 0 0  Down, Depressed, Hopeless 0 0 0 0 0  PHQ - 2 Score 0 0 0 0 0    Fall Risk: Fall Risk  04/01/2016 10/03/2015 03/30/2015 09/30/2014 07/29/2014  Falls in the past year? No No No Yes Yes  Number falls in past yr: - - - 1 1  Injury with Fall? - - - No No     Assessment & Plan  1. Intermittent constipation  - polyethylene glycol powder (GLYCOLAX/MIRALAX) powder; Take 17 g by mouth 2 (two) times daily as needed.  Dispense: 3350 g; Refill: 1  2. Hematochezia  From hemorrhoids  3. Internal bleeding hemorrhoids  I was sent rx for Annusol [facility]

## 2016-05-17 NOTE — Progress Notes (Deleted)
Name: Shane Bowen   MRN: [id number]    DOB: 12-Sep-1945   Date:05/17/2016       Progress Note  Subjective  Chief Complaint  Chief Complaint  Patient presents with  . Rectal Pain    pt states that he thinks it is a rectal prolapse symptoms started last night  . Rectal Bleeding    HPI  *** Patient Active Problem List   Diagnosis Date Noted  . Special screening for malignant neoplasms, colon   . Other hydrocele 04/17/2015  . History of elevated PSA 04/17/2015  . Change in bowel movement 03/30/2015  . Elevated PSA 10/17/2014  . History of hematuria 10/17/2014  . Erectile dysfunction of organic origin 10/17/2014  . History of poliomyelitis 09/30/2014  . Gait instability 09/30/2014  . 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 Umbilical hernia without obstruction or gangrene 09/30/2014  . Kidney cysts 07/28/2014  . BPH (benign prostatic hyperplasia) 07/28/2014  . Insomnia, persistent 07/28/2014  . Dyslipidemia 07/28/2014  . Impotence of organic origin 07/28/2014  . Essential (primary) hypertension 07/28/2014  . Blood glucose elevated 07/28/2014  . Male hypogonadism 07/28/2014  . Obesity (BMI 30-39.9) 07/28/2014  . Obstructive apnea 07/28/2014  . Spermatocele 07/28/2014  . Vitamin D deficiency 11/10/2008    Past Surgical History:  Procedure Laterality Date  . ANKLE FUSION Right 05/2009  . COLONOSCOPY    . COLONOSCOPY WITH PROPOFOL N/A 04/27/2015   Procedure: COLONOSCOPY WITH PROPOFOL;  Surgeon: Midge Minium, MD;  Location: [facility];  Service: Endoscopy;  Laterality: N/A;  CPAP  . HYDROCELE EXCISION Right 01/29/2016   Procedure: HYDROCELECTOMY ADULT;  Surgeon: Vanna Scotland, MD;  Location: [facility];  Service: Urology;  Laterality: Right;  . HYDROCELE EXCISION Right 04/29/2016   Procedure: HYDROCELECTOMY ADULT;  Surgeon: Vanna Scotland, MD;  Location: [facility];  Service: Urology;  Laterality: Right;  . INGUINAL HERNIA REPAIR    . LEG SURGERY    . LUMBAR LAMINECTOMY    .  Right leg surgery     from polio age 89  . TENDON GRAFT Right 2009   Hand after Lacertion Injury     Family History  Problem Relation Age of Onset  . Hyperlipidemia Mother   . Heart disease Father   . Heart disease Sister     ATF  . Prostate cancer Neg Hx   . Kidney disease Neg Hx     Social History   Social History  . Marital status: Widowed    Spouse name: N/A  . Number of children: N/A  . Years of education: N/A   Occupational History  . Not on file.   Social History Main Topics  . Smoking status: Former Smoker    Packs/day: 1.50    Years: 25.00    Types: Cigarettes    Start date: 01/22/1968    Quit date: 01/21/1993  . Smokeless tobacco: Never Used  . Alcohol use 4.2 oz/week    7 Glasses of wine per week     Comment:    . Drug use: No  . Sexual activity: Not Currently   Other Topics Concern  . Not on file   Social History Narrative  . No narrative on file     Current Outpatient Prescriptions:  .  amlodipine-benazepril (LOTREL) 2.5-10 MG capsule, Take 1 capsule by mouth daily., Disp: 90 capsule, Rfl: 1 .  aspirin 81 MG tablet, Take 81 mg by mouth daily. , Disp: , Rfl:  .  atorvastatin (LIPITOR) 40 MG tablet,  Take 1 tablet (40 mg total) by mouth daily., Disp: 90 tablet, Rfl: 1 .  Cholecalciferol (VITAMIN D) 2000 units CAPS, Take 1 capsule (2,000 Units total) by mouth daily., Disp: 30 capsule, Rfl: 0 .  docusate sodium (COLACE) 100 MG capsule, Take 100 mg by mouth 2 (two) times daily as needed for mild constipation., Disp: , Rfl:  .  docusate sodium (COLACE) 100 MG capsule, Take 1 capsule (100 mg total) by mouth 2 (two) times daily., Disp: 60 capsule, Rfl: 0 .  finasteride (PROSCAR) 5 MG tablet, Take 1 tablet (5 mg total) by mouth daily., Disp: 30 tablet, Rfl: 6 .  HYDROcodone-acetaminophen (NORCO/VICODIN) 5-325 MG tablet, Take 1-2 tablets by mouth every 6 (six) hours as needed for moderate pain., Disp: 10 tablet, Rfl: 0 .  Melatonin 2.5 MG CAPS, Take 2.5 mg by  mouth at bedtime as needed (sleep)., Disp: , Rfl:  .  naproxen sodium (ANAPROX) 220 MG tablet, Take 220 mg by mouth 2 (two) times daily as needed (pain)., Disp: , Rfl:  .  polyethylene glycol powder (GLYCOLAX/MIRALAX) powder, Take 17 g by mouth 2 (two) times daily as needed., Disp: 3350 g, Rfl: 1 .  predniSONE (DELTASONE) 20 MG tablet, Take 1 tablet (20 mg total) by mouth daily with breakfast., Disp: 10 tablet, Rfl: 0 .  saw palmetto 500 MG capsule, Take 450 mg by mouth daily. , Disp: , Rfl:  .  vitamin B-12 (CYANOCOBALAMIN) 1000 MCG tablet, Take 1,000 mcg by mouth daily., Disp: , Rfl:   Allergies  Allergen Reactions  . Duloxetine Hcl     Doesn't remember reaction     ROS  ***  Objective  Vitals:   05/17/16 1126  BP: (!) 144/74  Pulse: 99  Resp: 16  Temp: 98.8 F (37.1 C)  SpO2: 93%  Weight: 212 lb 8 oz (96.4 kg)  Height:  (1.753 m)    Body mass index is 31.38 kg/m.  Physical Exam ***  Recent Results (from the past 2160 hour(s))  PSA     Status: None   Collection Time: 04/08/16 10:24 AM  Result Value Ref Range   Prostate Specific Ag, Serum 2.4 0.0 - 4.0 ng/mL    Comment: Roche ECLIA methodology. According to the American Urological Association, Serum PSA should decrease and remain at undetectable levels after radical prostatectomy. The AUA defines biochemical recurrence as an initial PSA value 0.2 ng/mL or greater followed by a subsequent confirmatory PSA value 0.2 ng/mL or greater. Values obtained with different assay methods or kits cannot be used interchangeably. Results cannot be interpreted as absolute evidence of the presence or absence of malignant disease.   CBC     Status: None   Collection Time: 04/22/16  1:39 PM  Result Value Ref Range   WBC 7.2 3.8 - 10.6 K/uL   RBC 5.10 4.40 - 5.90 MIL/uL   Hemoglobin 14.5 13.0 - 18.0 g/dL   HCT 16.1 09.6 - 04.5 %   MCV 84.9 80.0 - 100.0 fL   MCH 28.3 26.0 - 34.0 pg   MCHC 33.4 32.0 - 36.0 g/dL   RDW  40.9 81.1 - 91.4 %   Platelets 169 150 - 440 K/uL  Surgical pathology     Status: None   Collection Time: 04/29/16  2:17 PM  Result Value Ref Range   SURGICAL PATHOLOGY      Surgical Pathology CASE: [id number] PATIENT: Shane Bowen Surgical Pathology Report     SPECIMEN SUBMITTED: A. Hydrocele  CLINICAL  HISTORY: None provided  PRE-OPERATIVE DIAGNOSIS: Hydrocele right  POST-OPERATIVE DIAGNOSIS: Same as pre-op     DIAGNOSIS: A. HYDROCELE, RIGHT; HYDROCELECTOMY: - HYDROCELE SAC WITH CHRONIC INFLAMMATION.   GROSS DESCRIPTION: A. Labeled: hydrocele Tissue fragment(s): 3 Size: aggregate 11.2 x 7.5 x 0.8 cm Description:  shaggy tan purple fibrous tissue  Representative submitted in one cassette(s).  Final Diagnosis performed by Ronald Lobo, MD.  Electronically signed 05/01/2016 6:33:56PM    The electronic signature indicates that the named Attending Pathologist has evaluated the specimen  Technical component performed at [facility], [address] [place], [place] [address] Lab: [phone] Dir: Titus Dubin. Cato Mulligan, MD  Professional component performed at [facility], [address] [place], [place], [place] [address] Lab: [phone] Dir: Georgiann Cocker. Rubinas, MD      Diabetic Foot Exam: Diabetic Foot Exam - Simple   No data filed     ***  PHQ2/9: Depression screen [facility] 2/9 04/01/2016 10/03/2015 03/30/2015 09/30/2014 07/29/2014  Decreased Interest 0 0 0 0 0  Down, Depressed, Hopeless 0 0 0 0 0  PHQ - 2 Score 0 0 0 0 0   ***  Fall Risk: Fall Risk  04/01/2016 10/03/2015 03/30/2015 09/30/2014 07/29/2014  Falls in the past year? No No No Yes Yes  Number falls in past yr: - - - 1 1  Injury with Fall? - - - No No   ***   Functional Status Survey:   ***   Assessment & Plan  1. Intermittent constipation *** - polyethylene glycol powder (GLYCOLAX/MIRALAX) powder; Take 17 g by mouth 2 (two) times daily as needed.  Dispense: 3350 g; Refill:  1  2. Hematochezia ***  3. Internal bleeding hemorrhoids ***

## 2016-05-17 NOTE — Patient Instructions (Signed)
Hemorrhoids Hemorrhoids are swollen veins in and around the rectum or anus. There are two types of hemorrhoids:  Internal hemorrhoids. These occur in the veins that are just inside the rectum. They may poke through to the outside and become irritated and painful.  External hemorrhoids. These occur in the veins that are outside of the anus and can be felt as a painful swelling or hard lump near the anus.  Most hemorrhoids do not cause serious problems, and they can be managed with home treatments such as diet and lifestyle changes. If home treatments do not help your symptoms, procedures can be done to shrink or remove the hemorrhoids. What are the causes? This condition is caused by increased pressure in the anal area. This pressure may result from various things, including:  Constipation.  Straining to have a bowel movement.  Diarrhea.  Pregnancy.  Obesity.  Sitting for long periods of time.  Heavy lifting or other activity that causes you to strain.  Anal sex.  What are the signs or symptoms? Symptoms of this condition include:  Pain.  Anal itching or irritation.  Rectal bleeding.  Leakage of stool (feces).  Anal swelling.  One or more lumps around the anus.  How is this diagnosed? This condition can often be diagnosed through a visual exam. Other exams or tests may also be done, such as:  Examination of the rectal area with a gloved hand (digital rectal exam).  Examination of the anal canal using a small tube (anoscope).  A blood test, if you have lost a significant amount of blood.  A test to look inside the colon (sigmoidoscopy or colonoscopy).  How is this treated? This condition can usually be treated at home. However, various procedures may be done if dietary changes, lifestyle changes, and other home treatments do not help your symptoms. These procedures can help make the hemorrhoids smaller or remove them completely. Some of these procedures involve  surgery, and others do not. Common procedures include:  Rubber band ligation. Rubber bands are placed at the base of the hemorrhoids to cut off the blood supply to them.  Sclerotherapy. Medicine is injected into the hemorrhoids to shrink them.  Infrared coagulation. A type of light energy is used to get rid of the hemorrhoids.  Hemorrhoidectomy surgery. The hemorrhoids are surgically removed, and the veins that supply them are tied off.  Stapled hemorrhoidopexy surgery. A circular stapling device is used to remove the hemorrhoids and use staples to cut off the blood supply to them.  Follow these instructions at home: Eating and drinking  Eat foods that have a lot of fiber in them, such as whole grains, beans, nuts, fruits, and vegetables. Ask your health care provider about taking products that have added fiber (fiber supplements).  Drink enough fluid to keep your urine clear or pale yellow. Managing pain and swelling  Take warm sitz baths for 20 minutes, 3-4 times a day to ease pain and discomfort.  If directed, apply ice to the affected area. Using ice packs between sitz baths may be helpful. ? Put ice in a plastic bag. ? Place a towel between your skin and the bag. ? Leave the ice on for 20 minutes, 2-3 times a day. General instructions  Take over-the-counter and prescription medicines only as told by your health care provider.  Use medicated creams or suppositories as told.  Exercise regularly.  Go to the bathroom when you have the urge to have a bowel movement. Do not wait.    Avoid straining to have bowel movements.  Keep the anal area dry and clean. Use wet toilet paper or moist towelettes after a bowel movement.  Do not sit on the toilet for long periods of time. This increases blood pooling and pain. Contact a health care provider if:  You have increasing pain and swelling that are not controlled by treatment or medicine.  You have uncontrolled bleeding.  You  have difficulty having a bowel movement, or you are unable to have a bowel movement.  You have pain or inflammation outside the area of the hemorrhoids. This information is not intended to replace advice given to you by your health care provider. Make sure you discuss any questions you have with your health care provider. Document Released: 01/05/2000 Document Revised: 06/07/2015 Document Reviewed: 09/21/2014 Elsevier Interactive Patient Education  2017 [employer]  

## 2016-05-29 ENCOUNTER — Ambulatory Visit (INDEPENDENT_AMBULATORY_CARE_PROVIDER_SITE_OTHER): Payer: Medicare Other | Admitting: Urology

## 2016-05-29 ENCOUNTER — Encounter: Payer: Self-pay | Admitting: Urology

## 2016-05-29 VITALS — BP 163/83 | HR 80 | Ht 69.0 in | Wt 209.0 lb

## 2016-05-29 DIAGNOSIS — N433 Hydrocele, unspecified: Secondary | ICD-10-CM

## 2016-05-29 NOTE — Progress Notes (Signed)
05/29/2016 10:58 AM   Shane Bowen [birth year] [id number]  Referring provider: Alba Cory, MD [address] [place], [place] [address]  Chief Complaint  Patient presents with  . Routine Post Op    4wk post hydrocele    HPI:   71 year old male status post redo right hydrocelectomy on 04/29/2016. Intraoperatively, he was noted to have 500 cc of brownish bloody fluid drained from his recurrent hydrocele sac with thickened scrotal wall and hydrocele sac.  Postoperatively, a stent very well. He's had a dramatic improvement in the size of his scrotum and is quite pleased with the result. He notes no notable recurrence at this time.  He has been wearing scrotal support since his last procedure.  No redness, drainage, or pain. He is voiding well.   PMH: Past Medical History:  Diagnosis Date  . Abnormal ECG   . Androgen deficiency   . Anemia   . Bilateral renal cysts   . BPH with elevated PSA   . Chronic insomnia   . Epididymal cyst   . Erectile dysfunction   . Gross hematuria   . Hepatitis    age 59 hepatitis A  . Hydrocele, right   . Hyperglycemia   . Hyperlipidemia   . Hypertension   . Incomplete bladder emptying   . Insomnia   . Lumbago   . Metabolic syndrome   . Obstructive sleep apnea    CPAP  . Over weight   . Polio   . Post-polio syndrome   . Prostate enlargement   . Scrotal pain   . Vitamin D deficiency     Surgical History: Past Surgical History:  Procedure Laterality Date  . ANKLE FUSION Right 05/2009  . COLONOSCOPY    . COLONOSCOPY WITH PROPOFOL N/A 04/27/2015   Procedure: COLONOSCOPY WITH PROPOFOL;  Surgeon: Midge Minium, MD;  [facility];  Service: Endoscopy;  Laterality: N/A;  CPAP  . HYDROCELE EXCISION Right 01/29/2016   Procedure: HYDROCELECTOMY ADULT;  Surgeon: Vanna Scotland, MD;  Location: [facility];  Service: Urology;  Laterality: Right;  . HYDROCELE EXCISION Right 04/29/2016   Procedure: HYDROCELECTOMY  ADULT;  Surgeon: Vanna Scotland, MD;  Location: [facility];  Service: Urology;  Laterality: Right;  . INGUINAL HERNIA REPAIR    . LEG SURGERY    . LUMBAR LAMINECTOMY    . Right leg surgery     from polio age 47  . TENDON GRAFT Right 2009   Hand after Lacertion Injury     Home Medications:  Allergies as of 05/29/2016      Reactions   Duloxetine Hcl    Doesn't remember reaction      Medication List       Accurate as of 05/29/16 10:58 AM. Always use your most recent med list.          amlodipine-benazepril 2.5-10 MG capsule Commonly known as:  LOTREL Take 1 capsule by mouth daily.   aspirin 81 MG tablet Take 81 mg by mouth daily.   atorvastatin 40 MG tablet Commonly known as:  LIPITOR Take 1 tablet (40 mg total) by mouth daily.   docusate sodium 100 MG capsule Commonly known as:  COLACE Take 100 mg by mouth 2 (two) times daily as needed for mild constipation.   finasteride 5 MG tablet Commonly known as:  PROSCAR Take 1 tablet (5 mg total) by mouth daily.   Melatonin 2.5 MG Caps Take 2.5 mg by mouth at bedtime as needed (sleep).  naproxen sodium 220 MG tablet Commonly known as:  ANAPROX Take 220 mg by mouth 2 (two) times daily as needed (pain).   vitamin B-12 1000 MCG tablet Commonly known as:  CYANOCOBALAMIN Take 1,000 mcg by mouth daily.   Vitamin D 2000 units Caps Take 1 capsule (2,000 Units total) by mouth daily.       Allergies:  Allergies  Allergen Reactions  . Duloxetine Hcl     Doesn't remember reaction    Family History: Family History  Problem Relation Age of Onset  . Hyperlipidemia Mother   . Heart disease Father   . Heart disease Sister     ATF  . Prostate cancer Neg Hx   . Kidney disease Neg Hx     Social History:  reports that he quit smoking about 23 years ago. His smoking use included Cigarettes. He started smoking about 48 years ago. He has a 37.50 pack-year smoking history. He has never used smokeless tobacco. He reports that he  drinks about 4.2 oz of alcohol per week . He reports that he does not use drugs.  ROS: UROLOGY Frequent Urination?: No Hard to postpone urination?: No Burning/pain with urination?: No Get up at night to urinate?: Yes Leakage of urine?: No Urine stream starts and stops?: Yes Trouble starting stream?: No Do you have to strain to urinate?: No Blood in urine?: No Urinary tract infection?: No Sexually transmitted disease?: No Injury to kidneys or bladder?: No Painful intercourse?: No Weak stream?: No Erection problems?: No Penile pain?: No  Gastrointestinal Nausea?: No Vomiting?: No Indigestion/heartburn?: No Diarrhea?: No Constipation?: No  Constitutional Fever: No Night sweats?: No Weight loss?: No Fatigue?: No  Skin Skin rash/lesions?: No Itching?: No  Eyes Blurred vision?: No Double vision?: No  Ears/Nose/Throat Sore throat?: No Sinus problems?: No  Hematologic/Lymphatic Swollen glands?: No Easy bruising?: No  Cardiovascular Leg swelling?: No Chest pain?: No  Respiratory Cough?: No Shortness of breath?: No  Endocrine Excessive thirst?: No  Musculoskeletal Back pain?: No Joint pain?: No  Neurological Headaches?: No Dizziness?: No  Psychologic Depression?: No Anxiety?: No  Physical Exam: BP (!) 163/83   Pulse 80   Ht 5\' 9"  (1.753 m)   Wt 209 lb (94.8 kg)   BMI 30.86 kg/m   Constitutional:  Alert and oriented, No acute distress. HEENT: [place] AT, moist mucus membranes.  Trachea midline, no masses. Cardiovascular: No clubbing, cyanosis, or edema. Respiratory: Normal respiratory effort, no increased work of breathing. GI: Abdomen is soft, nontender, nondistended, no abdominal masses GU: Right hemiscrotal incision well-healed. Mild subtle firmness around the right testicle but no obvious recurrent hydrocele. Overall, cosmetically good results. Skin: No rashes, bruises or suspicious lesions. Neurologic: Grossly intact, no focal deficits,  moving all 4 extremities. Psychiatric: Normal mood and affect.  Laboratory Data: Lab Results  Component Value Date   WBC 7.2 04/22/2016   HGB 14.5 04/22/2016   HCT 43.3 04/22/2016   MCV 84.9 04/22/2016   PLT 169 04/22/2016    Lab Results  Component Value Date   CREATININE 0.72 10/04/2015    Lab Results  Component Value Date   PSA Normal 02/25/2014    Lab Results  Component Value Date   HGBA1C 5.0 10/04/2015    Urinalysis    Component Value Date/Time   APPEARANCEUR Clear 11/15/2015 1415   GLUCOSEU Negative 11/15/2015 1415   BILIRUBINUR Negative 11/15/2015 1415   PROTEINUR Negative 11/15/2015 1415   NITRITE Negative 11/15/2015 1415   LEUKOCYTESUR Negative 11/15/2015 1415  Pertinent Imaging: n/a  Assessment & Plan:    1. Right hydrocele No evidence of recurrence, excellent cosmesis He is overall pleased with the result  She will follow-up with Michiel Cowboy for his routine urological care as scheduled  Vanna Scotland, MD  [facility] [address], [address] [place], [place] [address] [phone]

## 2016-09-02 ENCOUNTER — Other Ambulatory Visit: Payer: Self-pay | Admitting: Urology

## 2016-09-02 DIAGNOSIS — N401 Enlarged prostate with lower urinary tract symptoms: Secondary | ICD-10-CM

## 2016-10-02 ENCOUNTER — Other Ambulatory Visit: Payer: Self-pay | Admitting: Family Medicine

## 2016-10-02 DIAGNOSIS — E785 Hyperlipidemia, unspecified: Secondary | ICD-10-CM

## 2016-10-02 NOTE — Telephone Encounter (Signed)
[facility]

## 2016-10-03 ENCOUNTER — Ambulatory Visit (INDEPENDENT_AMBULATORY_CARE_PROVIDER_SITE_OTHER): Payer: Medicare Other | Admitting: Family Medicine

## 2016-10-03 ENCOUNTER — Encounter: Payer: Self-pay | Admitting: Family Medicine

## 2016-10-03 VITALS — BP 138/64 | HR 81 | Temp 98.3°F | Resp 16 | Ht 69.0 in | Wt 224.2 lb

## 2016-10-03 DIAGNOSIS — Z23 Encounter for immunization: Secondary | ICD-10-CM

## 2016-10-03 DIAGNOSIS — I10 Essential (primary) hypertension: Secondary | ICD-10-CM

## 2016-10-03 DIAGNOSIS — R739 Hyperglycemia, unspecified: Secondary | ICD-10-CM

## 2016-10-03 DIAGNOSIS — Z0000 Encounter for general adult medical examination without abnormal findings: Secondary | ICD-10-CM | POA: Diagnosis not present

## 2016-10-03 DIAGNOSIS — E785 Hyperlipidemia, unspecified: Secondary | ICD-10-CM | POA: Diagnosis not present

## 2016-10-03 DIAGNOSIS — E559 Vitamin D deficiency, unspecified: Secondary | ICD-10-CM

## 2016-10-03 DIAGNOSIS — R7309 Other abnormal glucose: Secondary | ICD-10-CM | POA: Diagnosis not present

## 2016-10-03 DIAGNOSIS — R972 Elevated prostate specific antigen [PSA]: Secondary | ICD-10-CM

## 2016-10-03 DIAGNOSIS — E538 Deficiency of other specified B group vitamins: Secondary | ICD-10-CM | POA: Diagnosis not present

## 2016-10-03 DIAGNOSIS — G4733 Obstructive sleep apnea (adult) (pediatric): Secondary | ICD-10-CM

## 2016-10-03 MED ORDER — AMLODIPINE BESY-BENAZEPRIL HCL 2.5-10 MG PO CAPS: 1.0000 | ORAL_CAPSULE | Freq: Every day | ORAL | 1 refills | Status: DC

## 2016-10-03 NOTE — Patient Instructions (Signed)
Preventive Care 65 Years and Older, Male Preventive care refers to lifestyle choices and visits with your health care provider that can promote health and wellness. What does preventive care include?  A yearly physical exam. This is also called an annual well check.  Dental exams once or twice a year.  Routine eye exams. Ask your health care provider how often you should have your eyes checked.  Personal lifestyle choices, including: ? Daily care of your teeth and gums. ? Regular physical activity. ? Eating a healthy diet. ? Avoiding tobacco and drug use. ? Limiting alcohol use. ? Practicing safe sex. ? Taking low doses of aspirin every day. ? Taking vitamin and mineral supplements as recommended by your health care provider. What happens during an annual well check? The services and screenings done by your health care provider during your annual well check will depend on your age, overall health, lifestyle risk factors, and family history of disease. Counseling Your health care provider may ask you questions about your:  Alcohol use.  Tobacco use.  Drug use.  Emotional well-being.  Home and relationship well-being.  Sexual activity.  Eating habits.  History of falls.  Memory and ability to understand (cognition).  Work and work environment.  Screening You may have the following tests or measurements:  Height, weight, and BMI.  Blood pressure.  Lipid and cholesterol levels. These may be checked every 5 years, or more frequently if you are over 50 years old.  Skin check.  Lung cancer screening. You may have this screening every year starting at age 55 if you have a 30-pack-year history of smoking and currently smoke or have quit within the past 15 years.  Fecal occult blood test (FOBT) of the stool. You may have this test every year starting at age 50.  Flexible sigmoidoscopy or colonoscopy. You may have a sigmoidoscopy every 5 years or a colonoscopy every 10  years starting at age 50.  Prostate cancer screening. Recommendations will vary depending on your family history and other risks.  Hepatitis C blood test.  Hepatitis B blood test.  Sexually transmitted disease (STD) testing.  Diabetes screening. This is done by checking your blood sugar (glucose) after you have not eaten for a while (fasting). You may have this done every 1-3 years.  Abdominal aortic aneurysm (AAA) screening. You may need this if you are a current or former smoker.  Osteoporosis. You may be screened starting at age 70 if you are at high risk.  Talk with your health care provider about your test results, treatment options, and if necessary, the need for more tests. Vaccines Your health care provider may recommend certain vaccines, such as:  Influenza vaccine. This is recommended every year.  Tetanus, diphtheria, and acellular pertussis (Tdap, Td) vaccine. You may need a Td booster every 10 years.  Varicella vaccine. You may need this if you have not been vaccinated.  Zoster vaccine. You may need this after age 60.  Measles, mumps, and rubella (MMR) vaccine. You may need at least one dose of MMR if you were born in 1957 or later. You may also need a second dose.  Pneumococcal 13-valent conjugate (PCV13) vaccine. One dose is recommended after age 69.  Pneumococcal polysaccharide (PPSV23) vaccine. One dose is recommended after age 69.  Meningococcal vaccine. You may need this if you have certain conditions.  Hepatitis A vaccine. You may need this if you have certain conditions or if you travel or work in places where you   may be exposed to hepatitis A.  Hepatitis B vaccine. You may need this if you have certain conditions or if you travel or work in places where you may be exposed to hepatitis B.  Haemophilus influenzae type b (Hib) vaccine. You may need this if you have certain risk factors.  Talk to your health care provider about which screenings and vaccines  you need and how often you need them. This information is not intended to replace advice given to you by your health care provider. Make sure you discuss any questions you have with your health care provider. Document Released: 02/03/2015 Document Revised: 09/27/2015 Document Reviewed: 11/08/2014 Elsevier Interactive Patient Education  2017 [employer].

## 2016-10-03 NOTE — Progress Notes (Signed)
Patient: Shane Bowen, Male    DOB: 11/11/1945, 71 y.o.   MRN: [id number]  Visit Date: 10/03/2016  Today's Provider: Ruel FavorsKrichna F Andee Chivers, MD   Chief Complaint  Patient presents with  . Annual Exam  . Flu Vaccine  . Follow-up    Subjective:    HPI Shane Bowen is a 71 y.o. male who presents today for his Subsequent Annual Wellness Visit.  Patient/Caregiver input:  Frustrated with weight gain  OSA: he wears APAP every night. He likes the new machine, no snoring.   HTN: bp here is at goal., he states also at goal when checked at home. No chest pain or palpitation  Dyslipidemia: lipid panel looked normal, but his ASCVD was 14% back in 2017, he is on Atorvastatin, also on a healthy diet and we will recheck labs. Denies any side effects of medication. No chest pain.  Obesity: he has lost 64 lbs from March 2017 till March 2018, he states he started to lose weight after tooth abscess, and got motivated to stay on a diet after that ( started Dec 2016), he was feeling well. He states he had cut out carbohydrates, no desserts, smaller portions, and replaced his snacks with high protein snacks instead of chips, and he had been drinking broth late at night instead of eating. He gained some weight since last visit 15 lbs since last visit, he states re-started some carb on his diet, but will resume a more strict diet again to try to lose more weight again. He is also going to a water-aerobic class a couple of weeks ago  Elevated PSA history of hydrocele: seeing Urologist, has a follow up soon and we will recheck labs for them.   Gait instability: he is doing well , still using a cane and has a brace that is helping with his gait. No falls.   Review of Systems  Constitutional: Negative for fever , positive weight change.  Respiratory: Negative for cough and shortness of breath.   Cardiovascular: Negative for chest pain or palpitations.  Gastrointestinal: Negative for abdominal pain, no  bowel changes.  Musculoskeletal: Positive  for gait problem , no  joint swelling. He wears a brace on right lower leg  Skin: Negative for rash.  Neurological: Negative for dizziness or headache.  No other specific complaints in a complete review of systems (except as listed in HPI above).  Past Medical History:  Diagnosis Date  . Abnormal ECG   . Androgen deficiency   . Anemia   . Bilateral renal cysts   . BPH with elevated PSA   . Chronic insomnia   . Epididymal cyst   . Erectile dysfunction   . Gross hematuria   . Hepatitis    age 71 hepatitis A  . Hydrocele, right   . Hyperglycemia   . Hyperlipidemia   . Hypertension   . Incomplete bladder emptying   . Insomnia   . Lumbago   . Metabolic syndrome   . Obstructive sleep apnea    CPAP  . Over weight   . Polio   . Post-polio syndrome   . Prostate enlargement   . Scrotal pain   . Vitamin D deficiency     Past Surgical History:  Procedure Laterality Date  . ANKLE FUSION Right 05/2009  . COLONOSCOPY    . COLONOSCOPY WITH PROPOFOL N/A 04/27/2015   Procedure: COLONOSCOPY WITH PROPOFOL;  Surgeon: Midge Miniumarren Wohl, MD;  [facility];  Service: Endoscopy;  Laterality: N/A;  CPAP  . HYDROCELE EXCISION Right 01/29/2016   Procedure: HYDROCELECTOMY ADULT;  Surgeon: Vanna Scotland, MD;  Location: [facility];  Service: Urology;  Laterality: Right;  . HYDROCELE EXCISION Right 04/29/2016   Procedure: HYDROCELECTOMY ADULT;  Surgeon: Vanna Scotland, MD;  Location: [facility];  Service: Urology;  Laterality: Right;  . INGUINAL HERNIA REPAIR    . LEG SURGERY    . LUMBAR LAMINECTOMY    . Right leg surgery     from polio age 61  . TENDON GRAFT Right 2009   Hand after Lacertion Injury     Family History  Problem Relation Age of Onset  . Hyperlipidemia Mother   . Heart disease Father   . Heart disease Sister        ATF  . Prostate cancer Neg Hx   . Kidney disease Neg Hx     Social History   Social History  . Marital status:  Widowed    Spouse name: N/A  . Number of children: N/A  . Years of education: N/A   Occupational History  . Not on file.   Social History Main Topics  . Smoking status: Former Smoker    Packs/day: 1.50    Years: 25.00    Types: Cigarettes    Start date: 01/22/1968    Quit date: 01/21/1993  . Smokeless tobacco: Never Used  . Alcohol use 4.2 oz/week    7 Glasses of wine per week     Comment:    . Drug use: No  . Sexual activity: Not Currently   Other Topics Concern  . Not on file   Social History Narrative  . No narrative on file    Outpatient Encounter Prescriptions as of 10/03/2016  Medication Sig  . amlodipine-benazepril (LOTREL) 2.5-10 MG capsule Take 1 capsule by mouth daily.  Marland Kitchen aspirin 81 MG tablet Take 81 mg by mouth daily.   Marland Kitchen atorvastatin (LIPITOR) 40 MG tablet TAKE ONE TABLET BY MOUTH EVERY DAY  . Cholecalciferol (VITAMIN D) 2000 units CAPS Take 1 capsule (2,000 Units total) by mouth daily.  Marland Kitchen docusate sodium (COLACE) 100 MG capsule Take 100 mg by mouth 2 (two) times daily as needed for mild constipation.  . finasteride (PROSCAR) 5 MG tablet TAKE ONE TABLET BY MOUTH EVERY DAY  . Melatonin 2.5 MG CAPS Take 2.5 mg by mouth at bedtime as needed (sleep).  . naproxen sodium (ANAPROX) 220 MG tablet Take 220 mg by mouth 2 (two) times daily as needed (pain).  . vitamin B-12 (CYANOCOBALAMIN) 1000 MCG tablet Take 1,000 mcg by mouth daily.  . [DISCONTINUED] amlodipine-benazepril (LOTREL) 2.5-10 MG capsule Take 1 capsule by mouth daily.   No facility-administered encounter medications on file as of 10/03/2016.     Allergies  Allergen Reactions  . Duloxetine Hcl     Doesn't remember reaction    Care Team Updated in EHR: Yes  Last Vision Exam: 01/2016 Wears corrective lenses: Yes Last Dental Exam: 09/2016 Last Hearing Exam:  Not problems and does not want to be checked. Wears Hearing Aids: No  Functional Ability / Safety Screening 1.  Was the timed Get Up and Go test  shorter than 30 seconds?  yes 2.  Does the patient need help with the phone, transportation, shopping,      preparing meals, housework, laundry, medications, or managing money?  no 3.  Is the patient's home free of loose throw rugs in walkways, pet beds, electrical cords, etc?   no  Grab bars in the bathroom? yes      Handrails on the stairs?   N/A      Adequate lighting?   yes 4.  Has the patient noticed any hearing difficulties?   no  Diet Recall and Exercise Regimen: yes  Does patient have a HCPOA?    yes If yes, name and contact information: Dorann Ou Does patient have a living will or MOST form?  yes  Cancer Screenings: Skin: no issues Lung:  Low Dose CT Chest recommended if Age 3-80 years, 30 pack-year currently smoking OR have quit w/in 15years. Patient does not qualify.  Lifestyle risk factor issued reviewed: Diet, exercise, weight management, advised patient smoking is not healthy, nutrition/diet.   Colon: up to date 04/27/2015  Additional Screenings:   Hepatitis C Screening: is up to date Intimate Partner Violence: not currently dating  Objective:   Vitals: BP 138/64 (BP Location: Right Arm, Patient Position: Sitting, Cuff Size: Large)   Pulse 81   Temp 98.3 F (36.8 C)   Resp 16   Ht  (1.753 m)   Wt 224 lb 3 oz (101.7 kg)   SpO2 94%   BMI 33.11 kg/m  Body mass index is 33.11 kg/m.  No exam data present  Physical Exam Constitutional: Patient appears well-developed and well-nourished. Obese  No distress.  HEENT: head atraumatic, normocephalic, pupils equal and reactive to light, ears normal TM, neck supple, throat within normal limits Cardiovascular: Normal rate, regular rhythm and normal heart sounds.  No murmur heard. No BLE edema. Pulmonary/Chest: Effort normal and breath sounds normal. No respiratory distress. Abdominal: Soft.  There is no tenderness. Muscular Skeletal: atrophy of right leg from polio Psychiatric: Patient has a normal mood and  affect. behavior is normal. Judgment and thought content normal.  Cognitive Testing - 6-CIT  Correct? Score   What year is it? yes 0 Yes = 0    No = 4  What month is it? yes 0 Yes = 0    No = 3  Remember:     Floyde Parkins, [address], [place]     What time is it? yes 0 Yes = 0    No = 3  Count backwards from 20 to 1 yes 0 Correct = 0    1 error = 2   More than 1 error = 4  Say the months of the year in reverse. yes 0 Correct = 0    1 error = 2   More than 1 error = 4  What address did I ask you to remember? yes 0 Correct = 0  1 error = 2    2 error = 4    3 error = 6    4 error = 8    All wrong = 10       TOTAL SCORE  0/28   Interpretation:  Normal  Normal (0-7) Abnormal (8-28)   Fall Risk: Fall Risk  10/03/2016 04/01/2016 10/03/2015 03/30/2015 09/30/2014  Falls in the past year? No No No No Yes  Number falls in past yr: - - - - 1  Injury with Fall? - - - - No    Depression Screen Depression screen [facility] 2/9 10/03/2016 04/01/2016 10/03/2015 03/30/2015 09/30/2014  Decreased Interest 0 0 0 0 0  Down, Depressed, Hopeless 0 0 0 0 0  PHQ - 2 Score 0 0 0 0 0    No results found for this or any previous visit (  from the past 2160 hour(s)).  Assessment & Plan:    1. Medicare annual wellness visit, subsequent  Discussed importance of 150 minutes of physical activity weekly, eat two servings of fish weekly, eat one serving of tree nuts ( cashews, pistachios, pecans, almonds.Marland Kitchen) every other day, eat 6 servings of fruit/vegetables daily and drink plenty of water and avoid sweet beverages.   2. Need for influenza vaccination  - Flu vaccine HIGH DOSE PF (Fluzone High dose)  3. Essential (primary) hypertension  - amlodipine-benazepril (LOTREL) 2.5-10 MG capsule; Take 1 capsule by mouth daily.  Dispense: 90 capsule; Refill: 1 - COMPLETE METABOLIC PANEL WITH GFR - CBC with Differential/Platelet  4. Elevated glucose level  - Hemoglobin A1c - Insulin, fasting  5. Dyslipidemia  - Lipid  panel  6. Vitamin D deficiency  - VITAMIN D 25 Hydroxy (Vit-D Deficiency, Fractures)  7. Obstructive apnea  Good compliance  8. Elevated PSA  - PSA  9. B12 deficiency  - Vitamin B12   Exercise Activities and Dietary recommendations  - he wants to at least maintain his mobility status - he will try to lose some weight again  - Discussed health benefits of physical activity, and encouraged him to engage in regular exercise appropriate for his age and condition.   Immunization History  Administered Date(s) Administered  . Influenza, High Dose Seasonal PF 09/30/2014, 10/03/2015, 10/03/2016  . Influenza-Unspecified 09/28/2013  . Pneumococcal Conjugate-13 07/29/2014  . Pneumococcal Polysaccharide-23 06/22/2012  . Tdap 05/12/2007  . Zoster 10/03/2011    Health Maintenance  Topic Date Due  . Janet Berlin  05/11/2017  . COLONOSCOPY  04/26/2025  . INFLUENZA VACCINE  Completed  . Hepatitis C Screening  Completed  . PNA vac Low Risk Adult  Completed    Meds ordered this encounter  Medications  . amlodipine-benazepril (LOTREL) 2.5-10 MG capsule    Sig: Take 1 capsule by mouth daily.    Dispense:  90 capsule    Refill:  1    Current Outpatient Prescriptions:  .  amlodipine-benazepril (LOTREL) 2.5-10 MG capsule, Take 1 capsule by mouth daily., Disp: 90 capsule, Rfl: 1 .  aspirin 81 MG tablet, Take 81 mg by mouth daily. , Disp: , Rfl:  .  atorvastatin (LIPITOR) 40 MG tablet, TAKE ONE TABLET BY MOUTH EVERY DAY, Disp: 90 tablet, Rfl: 1 .  Cholecalciferol (VITAMIN D) 2000 units CAPS, Take 1 capsule (2,000 Units total) by mouth daily., Disp: 30 capsule, Rfl: 0 .  docusate sodium (COLACE) 100 MG capsule, Take 100 mg by mouth 2 (two) times daily as needed for mild constipation., Disp: , Rfl:  .  finasteride (PROSCAR) 5 MG tablet, TAKE ONE TABLET BY MOUTH EVERY DAY, Disp: 30 tablet, Rfl: 6 .  Melatonin 2.5 MG CAPS, Take 2.5 mg by mouth at bedtime as needed (sleep)., Disp: , Rfl:   .  naproxen sodium (ANAPROX) 220 MG tablet, Take 220 mg by mouth 2 (two) times daily as needed (pain)., Disp: , Rfl:  .  vitamin B-12 (CYANOCOBALAMIN) 1000 MCG tablet, Take 1,000 mcg by mouth daily., Disp: , Rfl:  Medications Discontinued During This Encounter  Medication Reason  . amlodipine-benazepril (LOTREL) 2.5-10 MG capsule Reorder    I have personally reviewed and addressed the Medicare Annual Wellness health risk assessment questionnaire and have noted the following in the patient's chart:  A.         Medical and social history & family history B.         Use of  alcohol, tobacco, and illicit drugs  C.         Current medications and supplements D.         Functional and Cognitive ability and status E.         Nutritional status F.         Physical activity G.        Advance directives H.         List of other physicians I.          Hospitalizations, surgeries, and ER visits in previous 12 months J.         Vitals K.         Screenings such as hearing, vision, cognitive function, and depression L.         Referrals and appointments: none   In addition, I have reviewed and discussed with patient certain preventive protocols, quality metrics, and best practice recommendations. A written personalized care plan for preventive services as well as general preventive health recommendations were provided to patient.  See attached scanned questionnaire for additional information.

## 2016-10-04 ENCOUNTER — Encounter: Payer: Medicare Other | Admitting: Family Medicine

## 2016-10-08 NOTE — Progress Notes (Signed)
10/10/2016 11:13 AM   Mcarthur Rossetti Pelham [birth year] [id number]  Referring provider: Alba Cory, MD [address] [place], [place] [address]  Chief Complaint  Patient presents with  . Hydrocele    6 month follow up  . Benign Prostatic Hypertrophy    HPI: Patient is a 71 year old Caucasian male with an erectile dysfunction, BPH with LUTS, history of elevated PSA, history of gross hematuria and hydrocele repair who presents today for a 6 month follow up.    Erectile dysfunction He has been having difficulty with erections for several years.   His major complaint is no partner.  His libido is preserved.   His risk factors for ED are age, BPH, testosterone deficiency, HTN, HLD and sleep apnea.  He denies any painful erections or curvatures with his erections.   He is still having/no longer having spontaneous erections.    BPH WITH LUTS  (prostate and/or bladder) His IPSS score today is 8, which is moderate lower urinary tract symptomatology.  He is mostly satisfied with his quality life due to his urinary symptoms.   His previous IPSS score was 11/1.  His major complaints today are nocturia, intermittency and hesitancy.  He has had these symptoms for several years.  He denies any dysuria, hematuria or suprapubic pain.   He currently taking finasteride 5 mg daily.    His has had a cystoscopy on 11/15/2015 which noted lateral lobe hypertrophy and elevated bladder neck   He also denies any recent fevers, chills, nausea or vomiting.  He does not have a family history of PCa.      IPSS    Row Name 10/10/16 1000         International Prostate Symptom Score   How often have you had the sensation of not emptying your bladder? Less than 1 in 5     How often have you had to urinate less than every two hours? Less than half the time     How often have you found you stopped and started again several times when you urinated? Less than half the time     How often have you  found it difficult to postpone urination? Less than 1 in 5 times     How often have you had a weak urinary stream? Less than 1 in 5 times     How often have you had to strain to start urination? Not at All     How many times did you typically get up at night to urinate? 1 Time     Total IPSS Score 8       Quality of Life due to urinary symptoms   If you were to spend the rest of your life with your urinary condition just the way it is now how would you feel about that? Mostly Satisfied        Score:  1-7 Mild 8-19 Moderate 20-35 Severe   History of elevated PSA Patient had an elevated PSA of 4.5 ng/mL on 10/03/2011. His PSA's have returned below 4 since that time. His current PSA is 1.5.    History of hematuria Patient underwent workup for gross hematuria in 04/2013 with CT urogram and cystoscopy. He was found to have bilateral renal cysts, an enlarged prostate and hydrocele. He had an episode of gross hematuria and underwent a second hematuria workup with CTU and cystoscopy in 2017.  No malignancies were found.  He does not report any gross hematuria.  His  UA today was negative.     History of hydrocele Patient underwent a right hydrocelectomy on 01/29/2016 for a large right hydrocele.  There were no complications during the procedure.  His post operative course was as expected and uneventful.  He then underwent a second hydrocelectomy on 04/29/2016.  His scrotum is swollen, but he has not had any drainage or pus.  He has not had fevers, chills, nausea or vomiting.   He has not had any difficulty with urination.   PMH: Past Medical History:  Diagnosis Date  . Abnormal ECG   . Androgen deficiency   . Anemia   . Bilateral renal cysts   . BPH with elevated PSA   . Chronic insomnia   . Epididymal cyst   . Erectile dysfunction   . Gross hematuria   . Hepatitis    age 37 hepatitis A  . Hydrocele, right   . Hyperglycemia   . Hyperlipidemia   . Hypertension   . Incomplete  bladder emptying   . Insomnia   . Lumbago   . Metabolic syndrome   . Obstructive sleep apnea    CPAP  . Over weight   . Polio   . Post-polio syndrome   . Prostate enlargement   . Scrotal pain   . Vitamin D deficiency     Surgical History: Past Surgical History:  Procedure Laterality Date  . ANKLE FUSION Right 05/2009  . COLONOSCOPY    . COLONOSCOPY WITH PROPOFOL N/A 04/27/2015   Procedure: COLONOSCOPY WITH PROPOFOL;  Surgeon: Midge Minium, MD;  Location[facility];  Service: Endoscopy;  Laterality: N/A;  CPAP  . HYDROCELE EXCISION Right 01/29/2016   Procedure: HYDROCELECTOMY ADULT;  Surgeon: Vanna Scotland, MD;  Location: [facility];  Service: Urology;  Laterality: Right;  . HYDROCELE EXCISION Right 04/29/2016   Procedure: HYDROCELECTOMY ADULT;  Surgeon: Vanna Scotland, MD;  Location: [facility];  Service: Urology;  Laterality: Right;  . INGUINAL HERNIA REPAIR    . LEG SURGERY    . LUMBAR LAMINECTOMY    . Right leg surgery     from polio age 89  . TENDON GRAFT Right 2009   Hand after Lacertion Injury     Home Medications:  Allergies as of 10/10/2016      Reactions   Duloxetine Hcl    Doesn't remember reaction      Medication List       Accurate as of 10/10/16 11:13 AM. Always use your most recent med list.          amlodipine-benazepril 2.5-10 MG capsule Commonly known as:  LOTREL Take 1 capsule by mouth daily.   aspirin 81 MG tablet Take 81 mg by mouth daily.   atorvastatin 40 MG tablet Commonly known as:  LIPITOR TAKE ONE TABLET BY MOUTH EVERY DAY   docusate sodium 100 MG capsule Commonly known as:  COLACE Take 100 mg by mouth 2 (two) times daily as needed for mild constipation.   finasteride 5 MG tablet Commonly known as:  PROSCAR Take 1 tablet (5 mg total) by mouth daily.   Melatonin 2.5 MG Caps Take 2.5 mg by mouth at bedtime as needed (sleep).   naproxen sodium 220 MG tablet Commonly known as:  ANAPROX Take 220 mg by mouth 2 (two) times daily  as needed (pain).   vitamin B-12 1000 MCG tablet Commonly known as:  CYANOCOBALAMIN Take 1,000 mcg by mouth daily.   Vitamin D 2000 units Caps Take 1 capsule (2,000 Units  total) by mouth daily.            Discharge Care Instructions        Start     Ordered   10/10/16 0000  Urinalysis, Complete     10/10/16 1029   10/10/16 0000  finasteride (PROSCAR) 5 MG tablet  Daily    Question:  Supervising Provider  Answer:  Vanna Scotland   10/10/16 1111      Allergies:  Allergies  Allergen Reactions  . Duloxetine Hcl     Doesn't remember reaction    Family History: Family History  Problem Relation Age of Onset  . Hyperlipidemia Mother   . Heart disease Father   . Heart disease Sister        ATF  . Prostate cancer Neg Hx   . Kidney disease Neg Hx   . Kidney cancer Neg Hx   . Bladder Cancer Neg Hx     Social History:  reports that he quit smoking about 23 years ago. His smoking use included Cigarettes. He started smoking about 48 years ago. He has a 37.50 pack-year smoking history. He has never used smokeless tobacco. He reports that he drinks about 4.2 oz of alcohol per week . He reports that he does not use drugs.  ROS: UROLOGY Frequent Urination?: No Hard to postpone urination?: No Burning/pain with urination?: No Get up at night to urinate?: Yes Leakage of urine?: No Urine stream starts and stops?: Yes Trouble starting stream?: Yes Do you have to strain to urinate?: No Blood in urine?: No Urinary tract infection?: No Sexually transmitted disease?: No Injury to kidneys or bladder?: No Painful intercourse?: No Weak stream?: No Erection problems?: No Penile pain?: No  Gastrointestinal Nausea?: No Vomiting?: No Indigestion/heartburn?: No Diarrhea?: No Constipation?: No  Constitutional Fever: No Night sweats?: No Weight loss?: No Fatigue?: No  Skin Skin rash/lesions?: No Itching?: No  Eyes Blurred vision?: No Double vision?:  No  Ears/Nose/Throat Sore throat?: No Sinus problems?: No  Hematologic/Lymphatic Swollen glands?: No Easy bruising?: No  Cardiovascular Leg swelling?: No Chest pain?: No  Respiratory Cough?: No Shortness of breath?: No  Endocrine Excessive thirst?: No  Musculoskeletal Back pain?: No Joint pain?: No  Neurological Headaches?: No Dizziness?: No  Psychologic Depression?: No Anxiety?: No  Physical Exam: BP (!) 148/95   Pulse 80   Ht  (1.753 m)   Wt 221 lb 1.6 oz (100.3 kg)   BMI 32.65 kg/m   Constitutional: Well nourished. Alert and oriented, No acute distress. HEENT: [place] AT, moist mucus membranes. Trachea midline, no masses. Cardiovascular: No clubbing, cyanosis, or edema. Respiratory: Normal respiratory effort, no increased work of breathing. GI: Abdomen is soft, non tender, non distended, no abdominal masses. Liver and spleen not palpable.  No hernias appreciated.  Stool sample for occult testing is not indicated.   GU: No CVA tenderness.  No bladder fullness or masses.  Patient with circumcised phallus.   Urethral meatus is patent.  No penile discharge. No penile lesions or rashes. Scrotum without lesions, cysts, rashes and/or edema.  Testicles are located scrotally bilaterally. No masses are appreciated in the testicles. Left and right epididymis are normal. Rectal: Patient with  normal sphincter tone. Anus and perineum without scarring or rashes. No rectal masses are appreciated. Prostate is approximately 55 grams, no nodules are appreciated. Seminal vesicles are normal.  Skin: No rashes, bruises or suspicious lesions. Lymph: No cervical or nguinal adenopathy. Neurologic: Grossly intact, no focal deficits, moving all 4  extremities. Psychiatric: Normal mood and affect.  Laboratory Data: PSA History  4.3 ng/mL on 10/17/2014  3.5 ng/mL on 11/10/2014  3.9 ng/mL on 04/11/2015  4.2 ng/mL on 10/12/2015  2.4 ng/mL on 04/08/2016  1.5 ng/mL on  10/03/2016    I have reviewed the labs    Assessment & Plan:    1. Erectile dysfunction  - patient does not have a partner at this time  2. BPH with LUTS  - IPSS score is 8/2, it is improving  - Continue conservative management, avoiding bladder irritants and timed voiding's  - most bothersome symptoms is/are nocturia  - Continue finasteride 5 mg daily:refills given  - RTC in 6 months for IPSS, PSA and exam   3. History of elevated PSA  - current PSA 1.5  - continue finasteride  - RTC in 6 months for exam and PSA  4. History of hematuria  - patient has underwent two hematuria work ups - one in 2015 and one in 2017 with CTU's and cystoscopies - no malignancies were found  - UA today is negative for hematuria  5. History of hydrocele:    - S/p hydrocelectomy, patient doing well   Return in about 6 months (around 04/09/2017) for IPSS, PSA and exam.  These notes generated with voice recognition software. I apologize for typographical errors.  Michiel Cowboy, PA-C  [facility] [address] [place], [place] [address] [phone]

## 2016-10-09 DIAGNOSIS — E785 Hyperlipidemia, unspecified: Secondary | ICD-10-CM | POA: Diagnosis not present

## 2016-10-09 DIAGNOSIS — I10 Essential (primary) hypertension: Secondary | ICD-10-CM | POA: Diagnosis not present

## 2016-10-09 DIAGNOSIS — E559 Vitamin D deficiency, unspecified: Secondary | ICD-10-CM | POA: Diagnosis not present

## 2016-10-09 DIAGNOSIS — E538 Deficiency of other specified B group vitamins: Secondary | ICD-10-CM | POA: Diagnosis not present

## 2016-10-09 DIAGNOSIS — R972 Elevated prostate specific antigen [PSA]: Secondary | ICD-10-CM | POA: Diagnosis not present

## 2016-10-09 DIAGNOSIS — R7309 Other abnormal glucose: Secondary | ICD-10-CM | POA: Diagnosis not present

## 2016-10-10 ENCOUNTER — Ambulatory Visit (INDEPENDENT_AMBULATORY_CARE_PROVIDER_SITE_OTHER): Payer: Medicare Other | Admitting: Urology

## 2016-10-10 ENCOUNTER — Encounter: Payer: Self-pay | Admitting: Urology

## 2016-10-10 VITALS — BP 148/95 | HR 80 | Ht 69.0 in | Wt 221.1 lb

## 2016-10-10 DIAGNOSIS — N401 Enlarged prostate with lower urinary tract symptoms: Secondary | ICD-10-CM | POA: Diagnosis not present

## 2016-10-10 DIAGNOSIS — Z87898 Personal history of other specified conditions: Secondary | ICD-10-CM

## 2016-10-10 DIAGNOSIS — N138 Other obstructive and reflux uropathy: Secondary | ICD-10-CM | POA: Diagnosis not present

## 2016-10-10 DIAGNOSIS — N529 Male erectile dysfunction, unspecified: Secondary | ICD-10-CM

## 2016-10-10 DIAGNOSIS — Z87448 Personal history of other diseases of urinary system: Secondary | ICD-10-CM | POA: Diagnosis not present

## 2016-10-10 DIAGNOSIS — N433 Hydrocele, unspecified: Secondary | ICD-10-CM

## 2016-10-10 LAB — MICROSCOPIC EXAMINATION: WBC, UA: NONE SEEN /hpf (ref 0–?)

## 2016-10-10 LAB — PSA: PSA: 1.5 ng/mL (ref <=4.0)

## 2016-10-10 LAB — CBC WITH DIFFERENTIAL/PLATELET
BASOS ABS: 29 {cells}/uL (ref 0–200)
Basophils Relative: 0.5 %
EOS ABS: 222 {cells}/uL (ref 15–500)
Eosinophils Relative: 3.9 %
HCT: 46.6 % (ref 38.5–50.0)
Hemoglobin: 15.6 g/dL (ref 13.2–17.1)
LYMPHS ABS: 1379 {cells}/uL (ref 850–3900)
MCH: 28.5 pg (ref 27.0–33.0)
MCHC: 33.5 g/dL (ref 32.0–36.0)
MCV: 85 fL (ref 80.0–100.0)
MPV: 11.4 fL (ref 7.5–12.5)
Monocytes Relative: 7.3 %
NEUTROS ABS: 3654 {cells}/uL (ref 1500–7800)
NEUTROS PCT: 64.1 %
PLATELETS: 187 10*3/uL (ref 140–400)
RBC: 5.48 10*6/uL (ref 4.20–5.80)
RDW: 13.4 % (ref 11.0–15.0)
TOTAL LYMPHOCYTE: 24.2 %
WBC mixed population: 416 cells/uL (ref 200–950)
WBC: 5.7 10*3/uL (ref 3.8–10.8)

## 2016-10-10 LAB — HEMOGLOBIN A1C
EAG (MMOL/L): 5.5 (calc)
Hgb A1c MFr Bld: 5.1 % of total Hgb (ref <5.7)
Mean Plasma Glucose: 100 (calc)

## 2016-10-10 LAB — COMPLETE METABOLIC PANEL WITH GFR
AG RATIO: 1.9 (calc) (ref 1.0–2.5)
ALT: 26 U/L (ref 9–46)
AST: 21 U/L (ref 10–35)
Albumin: 4.3 g/dL (ref 3.6–5.1)
Alkaline phosphatase (APISO): 67 U/L (ref 40–115)
BILIRUBIN TOTAL: 0.8 mg/dL (ref 0.2–1.2)
BUN: 17 mg/dL (ref 7–25)
CALCIUM: 9.6 mg/dL (ref 8.6–10.3)
CHLORIDE: 104 mmol/L (ref 98–110)
CO2: 25 mmol/L (ref 20–32)
Creat: 0.79 mg/dL (ref 0.70–1.25)
GFR, EST AFRICAN AMERICAN: 106 mL/min/{1.73_m2} (ref >=60)
GFR, EST NON AFRICAN AMERICAN: 92 mL/min/{1.73_m2} (ref >=60)
GLUCOSE: 80 mg/dL (ref 65–99)
Globulin: 2.3 g/dL (calc) (ref 1.9–3.7)
Potassium: 4.6 mmol/L (ref 3.5–5.3)
Sodium: 138 mmol/L (ref 135–146)
TOTAL PROTEIN: 6.6 g/dL (ref 6.1–8.1)

## 2016-10-10 LAB — INSULIN, FASTING: INSULIN: 3.9 u[IU]/mL (ref 2.0–19.6)

## 2016-10-10 LAB — URINALYSIS, COMPLETE
Bilirubin, UA: NEGATIVE
GLUCOSE, UA: NEGATIVE
Leukocytes, UA: NEGATIVE
Nitrite, UA: NEGATIVE
PROTEIN UA: NEGATIVE
Specific Gravity, UA: 1.02 (ref 1.005–1.030)
Urobilinogen, Ur: 0.2 mg/dL (ref 0.2–1.0)
pH, UA: 5.5 (ref 5.0–7.5)

## 2016-10-10 LAB — LIPID PANEL
Cholesterol: 142 mg/dL (ref <200)
HDL: 74 mg/dL (ref >40)
LDL Cholesterol (Calc): 52 mg/dL (calc)
Non-HDL Cholesterol (Calc): 68 mg/dL (calc) (ref <130)
TRIGLYCERIDES: 78 mg/dL (ref <150)
Total CHOL/HDL Ratio: 1.9 (calc) (ref <5.0)

## 2016-10-10 LAB — VITAMIN B12: Vitamin B-12: >2000 pg/mL — ABNORMAL HIGH (ref 200–1100)

## 2016-10-10 LAB — VITAMIN D 25 HYDROXY (VIT D DEFICIENCY, FRACTURES): VIT D 25 HYDROXY: 30 ng/mL (ref 30–100)

## 2016-10-10 MED ORDER — FINASTERIDE 5 MG PO TABS: 5.0000 mg | ORAL_TABLET | Freq: Every day | ORAL | 3 refills | Status: DC

## 2017-03-27 ENCOUNTER — Encounter: Payer: Self-pay | Admitting: Family Medicine

## 2017-03-27 ENCOUNTER — Ambulatory Visit (INDEPENDENT_AMBULATORY_CARE_PROVIDER_SITE_OTHER): Payer: Medicare Other | Admitting: Family Medicine

## 2017-03-27 VITALS — BP 126/64 | HR 85 | Temp 97.7°F | Resp 16 | Ht 69.0 in | Wt 235.1 lb

## 2017-03-27 DIAGNOSIS — E669 Obesity, unspecified: Secondary | ICD-10-CM

## 2017-03-27 DIAGNOSIS — E559 Vitamin D deficiency, unspecified: Secondary | ICD-10-CM

## 2017-03-27 DIAGNOSIS — E538 Deficiency of other specified B group vitamins: Secondary | ICD-10-CM

## 2017-03-27 DIAGNOSIS — G4733 Obstructive sleep apnea (adult) (pediatric): Secondary | ICD-10-CM

## 2017-03-27 DIAGNOSIS — R739 Hyperglycemia, unspecified: Secondary | ICD-10-CM | POA: Diagnosis not present

## 2017-03-27 DIAGNOSIS — Z9181 History of falling: Secondary | ICD-10-CM

## 2017-03-27 DIAGNOSIS — G14 Postpolio syndrome: Secondary | ICD-10-CM | POA: Diagnosis not present

## 2017-03-27 DIAGNOSIS — E785 Hyperlipidemia, unspecified: Secondary | ICD-10-CM | POA: Diagnosis not present

## 2017-03-27 DIAGNOSIS — Z8612 Personal history of poliomyelitis: Secondary | ICD-10-CM | POA: Diagnosis not present

## 2017-03-27 DIAGNOSIS — I10 Essential (primary) hypertension: Secondary | ICD-10-CM | POA: Diagnosis not present

## 2017-03-27 MED ORDER — ATORVASTATIN CALCIUM 40 MG PO TABS: 40.0000 mg | ORAL_TABLET | Freq: Every day | ORAL | 1 refills | Status: DC

## 2017-03-27 MED ORDER — AMLODIPINE BESY-BENAZEPRIL HCL 2.5-10 MG PO CAPS: 1.0000 | ORAL_CAPSULE | Freq: Every day | ORAL | 1 refills | Status: DC

## 2017-03-27 NOTE — Patient Instructions (Signed)

## 2017-03-27 NOTE — Progress Notes (Signed)
Name: Shane Bowen   MRN: [id number]    DOB: May 18, 1945   Date:03/27/2017       Progress Note  Subjective  Chief Complaint  Chief Complaint  Patient presents with  . Medication Refill    6 month F/U  . Hyperlipidemia  . Hypertension    Denies any symptoms  . Constipation    Going regular now with Colace at least once to twice a day    HPI  OSA: he wears CPAPevery night. No headaches in am's and denies snoring.   HTN: bp here is at goal., he states not checking bp at home lately. No chest pain or palpitation  Dyslipidemia: lipid panel looked normal, but his ASCVD was 14% back in 2017, he is on Atorvastatin, he has gained weight.since last visit. Denies any side effects of medication. No chest pain.  Obesity: he had lost 64 lbs from March 2017 till March 2018, he states he started to lose weight after tooth abscess, and got motivated to stay on a diet after that ( started Dec 2016), he was feeling well. He states he had cut out carbohydrates, no desserts, smaller portions, and replaced his snacks with high protein snacks instead of chips, and he had been drinking broth late at night instead of eating. He is gradually gaining weight again. He states he has been unable to skip evening snacks.  Elevated PSA history of hydrocele: seeing Urologist, has a follow up soon   Gait instability: he recently fell in his yard, he lost his balance, no injuries. Discussed more awareness.   B12 : down to supplementation three times a week  Vitamin D deficiency: he is still taking supplementation otc, and last level was normal     Patient Active Problem List   Diagnosis Date Noted  . Special screening for malignant neoplasms, colon   . Other hydrocele 04/17/2015  . History of elevated PSA 04/17/2015  . Change in bowel movement 03/30/2015  . Elevated PSA 10/17/2014  . History of hematuria 10/17/2014  . Erectile dysfunction of organic origin 10/17/2014  . History of poliomyelitis  09/30/2014  . Gait instability 09/30/2014  . 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 Umbilical hernia without obstruction or gangrene 09/30/2014  . Kidney cysts 07/28/2014  . BPH (benign prostatic hyperplasia) 07/28/2014  . Insomnia, persistent 07/28/2014  . Dyslipidemia 07/28/2014  . Impotence of organic origin 07/28/2014  . Essential (primary) hypertension 07/28/2014  . Blood glucose elevated 07/28/2014  . Male hypogonadism 07/28/2014  . Obesity (BMI 30-39.9) 07/28/2014  . Obstructive apnea 07/28/2014  . Spermatocele 07/28/2014  . Vitamin D deficiency 11/10/2008    Past Surgical History:  Procedure Laterality Date  . ANKLE FUSION Right 05/2009  . COLONOSCOPY    . COLONOSCOPY WITH PROPOFOL N/A 04/27/2015   Procedure: COLONOSCOPY WITH PROPOFOL;  Surgeon: Midge Miniumarren Wohl, MD;  [facility];  Service: Endoscopy;  Laterality: N/A;  CPAP  . HYDROCELE EXCISION Right 01/29/2016   Procedure: HYDROCELECTOMY ADULT;  Surgeon: Vanna ScotlandAshley Brandon, MD;  Location: [facility];  Service: Urology;  Laterality: Right;  . HYDROCELE EXCISION Right 04/29/2016   Procedure: HYDROCELECTOMY ADULT;  Surgeon: Vanna ScotlandAshley Brandon, MD;  Location: [facility];  Service: Urology;  Laterality: Right;  . INGUINAL HERNIA REPAIR    . LEG SURGERY    . LUMBAR LAMINECTOMY    . Right leg surgery     from polio age 89  . TENDON GRAFT Right 2009   Hand after Lacertion Injury  Family History  Problem Relation Age of Onset  . Hyperlipidemia Mother   . Heart disease Father   . Heart disease Sister        ATF  . Prostate cancer Neg Hx   . Kidney disease Neg Hx   . Kidney cancer Neg Hx   . Bladder Cancer Neg Hx     Social History   Socioeconomic History  . Marital status: Widowed    Spouse name: Not on file  . Number of children: Not on file  . Years of education: Not on file  . Highest education level: Not on file  Social Needs  . Financial resource strain: Not on file  . Food insecurity - worry: Not on file  .  Food insecurity - inability: Not on file  . Transportation needs - medical: Not on file  . Transportation needs - non-medical: Not on file  Occupational History  . Not on file  Tobacco Use  . Smoking status: Former Smoker    Packs/day: 1.50    Years: 25.00    Pack years: 37.50    Types: Cigarettes    Start date: 01/22/1968    Last attempt to quit: 01/21/1993    Years since quitting: 24.1  . Smokeless tobacco: Never Used  Substance and Sexual Activity  . Alcohol use: Yes    Alcohol/week: 4.2 oz    Types: 7 Glasses of wine per week    Comment:    . Drug use: No  . Sexual activity: Not Currently  Other Topics Concern  . Not on file  Social History Narrative  . Not on file     Current Outpatient Medications:  .  amlodipine-benazepril (LOTREL) 2.5-10 MG capsule, Take 1 capsule by mouth daily., Disp: 90 capsule, Rfl: 1 .  aspirin 81 MG tablet, Take 81 mg by mouth daily. , Disp: , Rfl:  .  atorvastatin (LIPITOR) 40 MG tablet, TAKE ONE TABLET BY MOUTH EVERY DAY, Disp: 90 tablet, Rfl: 1 .  Cholecalciferol (VITAMIN D) 2000 units CAPS, Take 1 capsule (2,000 Units total) by mouth daily., Disp: 30 capsule, Rfl: 0 .  docusate sodium (COLACE) 100 MG capsule, Take 100 mg by mouth 2 (two) times daily as needed for mild constipation., Disp: , Rfl:  .  finasteride (PROSCAR) 5 MG tablet, Take 1 tablet (5 mg total) by mouth daily., Disp: 90 tablet, Rfl: 3 .  Melatonin 2.5 MG CAPS, Take 2.5 mg by mouth at bedtime as needed (sleep)., Disp: , Rfl:  .  naproxen sodium (ANAPROX) 220 MG tablet, Take 220 mg by mouth 2 (two) times daily as needed (pain)., Disp: , Rfl:  .  vitamin B-12 (CYANOCOBALAMIN) 1000 MCG tablet, Take 1,000 mcg by mouth daily., Disp: , Rfl:   Allergies  Allergen Reactions  . Duloxetine Hcl     Doesn't remember reaction     ROS  Constitutional: Negative for fever , positive for  weight change- gain  Respiratory: Negative for cough and shortness of breath.   Cardiovascular:  Negative for chest pain or palpitations.  Gastrointestinal: Negative for abdominal pain, no bowel changes.  Musculoskeletal: Positive  for gait problem but no  joint swelling.  Skin: Negative for rash.  Neurological: Negative for dizziness or headache.  No other specific complaints in a complete review of systems (except as listed in HPI above).  Objective  Vitals:   03/27/17 0847  BP: 126/64  Pulse: 85  Resp: 16  Temp: 97.7 F (36.5 C)  TempSrc:  Oral  SpO2: 98%  Weight: 235 lb 1.6 oz (106.6 kg)  Height: 5\' 9"  (1.753 m)    Body mass index is 34.72 kg/m.  Physical Exam  Constitutional: Patient appears well-developed and well-nourished. Obese  No distress.  HEENT: head atraumatic, normocephalic, pupils equal and reactive to light, ears normal TM, neck supple, throat within normal limits Cardiovascular: Normal rate, regular rhythm and normal heart sounds.  No murmur heard. No BLE edema. Pulmonary/Chest: Effort normal and breath sounds normal. No respiratory distress. Abdominal: Soft.  There is no tenderness. Muscular Skeletal: atrophy of right leg from polio, difficulty bearing weight on right leg Psychiatric: Patient has a normal mood and affect. behavior is normal. Judgment and thought content normal.   PHQ2/9: Depression screen [facility] 2/9 03/27/2017 10/03/2016 04/01/2016 10/03/2015 03/30/2015  Decreased Interest 0 0 0 0 0  Down, Depressed, Hopeless 0 0 0 0 0  PHQ - 2 Score 0 0 0 0 0     Fall Risk: Fall Risk  03/27/2017 10/03/2016 04/01/2016 10/03/2015 03/30/2015  Falls in the past year? Yes No No No No  Number falls in past yr: 1 - - - -  Injury with Fall? No - - - -  Risk for fall due to : Impaired balance/gait - - - -  Follow up Falls prevention discussed - - - -     Functional Status Survey: Is the patient deaf or have difficulty hearing?: No Does the patient have difficulty seeing, even when wearing glasses/contacts?: No Does the patient have difficulty concentrating,  remembering, or making decisions?: No Does the patient have difficulty walking or climbing stairs?: No Does the patient have difficulty dressing or bathing?: No Does the patient have difficulty doing errands alone such as visiting a doctor's office or shopping?: No    Assessment & Plan  1. Essential (primary) hypertension  - amlodipine-benazepril (LOTREL) 2.5-10 MG capsule; Take 1 capsule by mouth daily.  Dispense: 90 capsule; Refill: 1  2. Dyslipidemia  - atorvastatin (LIPITOR) 40 MG tablet; Take 1 tablet (40 mg total) by mouth daily.  Dispense: 90 tablet; Refill: 1  3. Obstructive apnea  Continue CPAP   4. B12 deficiency  Taking three times a week   5. Vitamin D deficiency  Continue supplementation  6. Blood glucose elevated  Last hgbA1C was at goal  7. History of poliomyelitis  He is still exercise - water aerobics   8. Obesity (BMI 30-39.9)  Discussed with the patient the risk posed by an increased BMI. Discussed importance of portion control, calorie counting and at least 150 minutes of physical activity weekly. Avoid sweet beverages and drink more water. Eat at least 6 servings of fruit and vegetables daily   9. Post-polio syndrome   10. History of recent fall  Discussed fall prevention

## 2017-04-07 ENCOUNTER — Other Ambulatory Visit: Payer: Medicare Other

## 2017-04-07 ENCOUNTER — Other Ambulatory Visit: Payer: Self-pay

## 2017-04-07 DIAGNOSIS — N401 Enlarged prostate with lower urinary tract symptoms: Secondary | ICD-10-CM

## 2017-04-08 LAB — PSA: Prostate Specific Ag, Serum: 1.6 ng/mL (ref 0.0–4.0)

## 2017-04-08 NOTE — Progress Notes (Signed)
04/09/2017 9:17 AM   Shane Bowen [birth year] [id number]  Referring provider: Alba Cory, MD [address] [place], [place] [address]  No chief complaint on file.   HPI: Patient is a 72 year old Caucasian male with erectile dysfunction, BPH with LUTS, history of elevated PSA, history of gross hematuria and hydrocele repair who presents today for a 6 month follow up.    Erectile dysfunction He has been having difficulty with erections for several years.   His major complaint is no partner.  His libido is preserved.   His risk factors for ED are age, BPH, testosterone deficiency, HTN, HLD and sleep apnea.  He denies any painful erections or curvatures with his erections.   He is no longer having spontaneous erections.    BPH WITH LUTS  (prostate and/or bladder) His IPSS score today is 15, which is moderate lower urinary tract symptomatology.  He is mixed with his quality life due to his urinary symptoms.   His PVR is 53 mL.  His previous IPSS score was 8/2.  His major complaints today are frequency, urgency, incontinence, intermittency, straining to urinate and a weak stream.  He has had these symptoms for 5 months.  He denies any dysuria, hematuria or suprapubic pain.   He currently taking finasteride 5 mg daily.    His has had a cystoscopy on 11/15/2015 which noted lateral lobe hypertrophy and elevated bladder neck   He also denies any recent fevers, chills, nausea or vomiting.  He does not have a family history of PCa.  IPSS    Row Name 04/09/17 0800         International Prostate Symptom Score   How often have you had the sensation of not emptying your bladder?  Less than half the time     How often have you had to urinate less than every two hours?  Less than half the time     How often have you found you stopped and started again several times when you urinated?  More than half the time     How often have you found it difficult to postpone urination?   About half the time     How often have you had a weak urinary stream?  Less than half the time     How often have you had to strain to start urination?  Less than 1 in 5 times     How many times did you typically get up at night to urinate?  1 Time     Total IPSS Score  15       Quality of Life due to urinary symptoms   If you were to spend the rest of your life with your urinary condition just the way it is now how would you feel about that?  Mixed        Score:  1-7 Mild 8-19 Moderate 20-35 Severe   History of elevated PSA Patient had an elevated PSA of 4.5 ng/mL on 10/03/2011. His PSA's have returned below 4 since that time. His current PSA is 1.6.    History of hematuria Patient underwent workup for gross hematuria in 04/2013 with CT urogram and cystoscopy. He was found to have bilateral renal cysts, an enlarged prostate and hydrocele. He had an episode of gross hematuria and underwent a second hematuria workup with CTU and cystoscopy in 2017.  No malignancies were found.  He does not report any gross hematuria.  His UA today was  positive for 3-10 RBC's.   History of hydrocele Patient underwent a right hydrocelectomy on 01/29/2016 for a large right hydrocele.  There were no complications during the procedure. His post operative course was as expected and uneventful.  He then underwent a second hydrocelectomy on 04/29/2016.  His scrotum is swollen, but he has not had any drainage or pus.  He has not had fevers, chills, nausea or vomiting.   He has not had any difficulty with urination.   PMH: Past Medical History:  Diagnosis Date  . Abnormal ECG   . Androgen deficiency   . Anemia   . Bilateral renal cysts   . BPH with elevated PSA   . Chronic insomnia   . Epididymal cyst   . Erectile dysfunction   . Gross hematuria   . Hepatitis    age 72 hepatitis A  . Hydrocele, right   . Hyperglycemia   . Hyperlipidemia   . Hypertension   . Incomplete bladder emptying   .  Insomnia   . Lumbago   . Metabolic syndrome   . Obstructive sleep apnea    CPAP  . Over weight   . Polio   . Post-polio syndrome   . Prostate enlargement   . Scrotal pain   . Vitamin D deficiency     Surgical History: Past Surgical History:  Procedure Laterality Date  . ANKLE FUSION Right 05/2009  . COLONOSCOPY    . COLONOSCOPY WITH PROPOFOL N/A 04/27/2015   Procedure: COLONOSCOPY WITH PROPOFOL;  Surgeon: Midge Miniumarren Wohl, MD;  [facility];  Service: Endoscopy;  Laterality: N/A;  CPAP  . HYDROCELE EXCISION Right 01/29/2016   Procedure: HYDROCELECTOMY ADULT;  Surgeon: Vanna ScotlandAshley Brandon, MD;  Location: [facility];  Service: Urology;  Laterality: Right;  . HYDROCELE EXCISION Right 04/29/2016   Procedure: HYDROCELECTOMY ADULT;  Surgeon: Vanna ScotlandAshley Brandon, MD;  Location: [facility];  Service: Urology;  Laterality: Right;  . INGUINAL HERNIA REPAIR    . LEG SURGERY    . LUMBAR LAMINECTOMY    . Right leg surgery     from polio age 72  . TENDON GRAFT Right 2009   Hand after Lacertion Injury     Home Medications:  Allergies as of 04/09/2017      Reactions   Duloxetine Hcl    Doesn't remember reaction      Medication List        Accurate as of 04/09/17  9:17 AM. Always use your most recent med list.          amlodipine-benazepril 2.5-10 MG capsule Commonly known as:  LOTREL Take 1 capsule by mouth daily.   aspirin 81 MG tablet Take 81 mg by mouth daily.   atorvastatin 40 MG tablet Commonly known as:  LIPITOR Take 1 tablet (40 mg total) by mouth daily.   docusate sodium 100 MG capsule Commonly known as:  COLACE Take 100 mg by mouth 2 (two) times daily as needed for mild constipation.   finasteride 5 MG tablet Commonly known as:  PROSCAR Take 1 tablet (5 mg total) by mouth daily.   Melatonin 2.5 MG Caps Take 2.5 mg by mouth at bedtime as needed (sleep).   naproxen sodium 220 MG tablet Commonly known as:  ALEVE Take 220 mg by mouth 2 (two) times daily as needed  (pain).   vitamin B-12 1000 MCG tablet Commonly known as:  CYANOCOBALAMIN Take 1,000 mcg by mouth daily.   Vitamin D 2000 units Caps Take 1 capsule (2,000  Units total) by mouth daily.       Allergies:  Allergies  Allergen Reactions  . Duloxetine Hcl     Doesn't remember reaction    Family History: Family History  Problem Relation Age of Onset  . Hyperlipidemia Mother   . Heart disease Father   . Heart disease Sister        ATF  . Prostate cancer Neg Hx   . Kidney disease Neg Hx   . Kidney cancer Neg Hx   . Bladder Cancer Neg Hx     Social History:  reports that he quit smoking about 24 years ago. His smoking use included cigarettes. He started smoking about 49 years ago. He has a 37.50 pack-year smoking history. he has never used smokeless tobacco. He reports that he drinks about 4.2 oz of alcohol per week. He reports that he does not use drugs.  ROS: UROLOGY Frequent Urination?: Yes Hard to postpone urination?: Yes Burning/pain with urination?: No Get up at night to urinate?: No Leakage of urine?: Yes Urine stream starts and stops?: Yes Trouble starting stream?: Yes Do you have to strain to urinate?: Yes Blood in urine?: No Urinary tract infection?: No Sexually transmitted disease?: No Injury to kidneys or bladder?: No Painful intercourse?: No Weak stream?: Yes Erection problems?: Yes Penile pain?: No  Gastrointestinal Nausea?: No Vomiting?: No Indigestion/heartburn?: No Diarrhea?: No Constipation?: No  Constitutional Fever: No Night sweats?: No Weight loss?: No Fatigue?: No  Skin Skin rash/lesions?: No Itching?: No  Eyes Blurred vision?: No Double vision?: No  Ears/Nose/Throat Sore throat?: No Sinus problems?: No  Hematologic/Lymphatic Swollen glands?: No Easy bruising?: Yes  Cardiovascular Leg swelling?: No Chest pain?: No  Respiratory Cough?: No Shortness of breath?: No  Endocrine Excessive thirst?:  No  Musculoskeletal Back pain?: No Joint pain?: No  Neurological Headaches?: No Dizziness?: No  Psychologic Depression?: No Anxiety?: No  Physical Exam: BP (!) 159/91   Pulse 81   Resp 16   Ht 5\' 9"  (1.753 m)   Wt 235 lb (106.6 kg)   SpO2 98%   BMI 34.70 kg/m   Constitutional: Well nourished. Alert and oriented, No acute distress. HEENT: [place] AT, moist mucus membranes. Trachea midline, no masses. Cardiovascular: No clubbing, cyanosis, or edema. Respiratory: Normal respiratory effort, no increased work of breathing. GI: Abdomen is soft, non tender, non distended, no abdominal masses. Liver and spleen not palpable.  No hernias appreciated.  Stool sample for occult testing is not indicated.   GU: No CVA tenderness.  No bladder fullness or masses.  Patient with circumcised phallus.   Urethral meatus is patent.  No penile discharge. No penile lesions or rashes. Scrotum without lesions, cysts, rashes and/or edema.  Testicles are located scrotally bilaterally. No masses are appreciated in the testicles. Left and right epididymis are normal.   Rectal: Patient with  normal sphincter tone. Anus and perineum without scarring or rashes. No rectal masses are appreciated. Prostate is approximately 55 grams, DRE limited to patient's body habitus, no nodules are appreciated. Seminal vesicles are normal. Skin: No rashes, bruises or suspicious lesions. Lymph: No cervical or inguinal adenopathy. Neurologic: Grossly intact, no focal deficits, moving all 4 extremities. Psychiatric: Normal mood and affect.    Laboratory Data: PSA History  4.3 ng/mL on 10/17/2014  3.5 ng/mL on 11/10/2014  3.9 ng/mL on 04/11/2015  4.2 ng/mL on 10/12/2015  2.4 ng/mL on 04/08/2016  1.5 ng/mL on 10/03/2016  1.6 ng/mL on 04/07/2017  I have reviewed the labs  Pertinent Imaging Results for ADEKUNLE, ROHRBACH (MRN [id number]) as of 04/09/2017 09:16  Ref. Range 04/09/2017 09:15  Scan Result Unknown 53ml      Assessment & Plan:    1. Erectile dysfunction  - patient does not have a partner at this time  2. BPH with LUTS  - IPSS score is 15/3, it is worsening  - Continue conservative management, avoiding bladder irritants and timed voiding's  - most bothersome symptoms is/are urgency and urge incontinence  - Continue finasteride 5 mg daily  - patient would like to be considered for a bladder outlet procedure so we will schedule a cystoscopy   - RTC in 6 months for IPSS, PSA and exam   3. History of elevated PSA  - current PSA 1.6  - continue finasteride  - RTC in 6 months for exam and PSA  4. History of hematuria  - patient has underwent two hematuria work ups - one in 2015 and one in 2017 with CTU's and cystoscopies - no malignancies were found  - UA today is positive for 3-10 RBC's - we will send for culture - he will be undergoing a cystoscopy for evaluation of BOO   - schedule RUS for evaluation   5. History of hydrocele:    - S/p hydrocelectomy, patient doing well   Return for RUS report and cystoscopy .  These notes generated with voice recognition software. I apologize for typographical errors.  Michiel Cowboy, PA-C  [facility] [address], [address] [place], [place] [address] [phone]

## 2017-04-09 ENCOUNTER — Encounter: Payer: Self-pay | Admitting: Urology

## 2017-04-09 ENCOUNTER — Ambulatory Visit (INDEPENDENT_AMBULATORY_CARE_PROVIDER_SITE_OTHER): Payer: Medicare Other | Admitting: Urology

## 2017-04-09 VITALS — BP 159/91 | HR 81 | Resp 16 | Ht 69.0 in | Wt 235.0 lb

## 2017-04-09 DIAGNOSIS — N3941 Urge incontinence: Secondary | ICD-10-CM | POA: Diagnosis not present

## 2017-04-09 DIAGNOSIS — N401 Enlarged prostate with lower urinary tract symptoms: Secondary | ICD-10-CM

## 2017-04-09 DIAGNOSIS — Z87898 Personal history of other specified conditions: Secondary | ICD-10-CM

## 2017-04-09 DIAGNOSIS — N138 Other obstructive and reflux uropathy: Secondary | ICD-10-CM

## 2017-04-09 DIAGNOSIS — Z87448 Personal history of other diseases of urinary system: Secondary | ICD-10-CM | POA: Diagnosis not present

## 2017-04-09 DIAGNOSIS — R3129 Other microscopic hematuria: Secondary | ICD-10-CM

## 2017-04-09 DIAGNOSIS — N529 Male erectile dysfunction, unspecified: Secondary | ICD-10-CM

## 2017-04-09 LAB — URINALYSIS, COMPLETE
Bilirubin, UA: NEGATIVE
GLUCOSE, UA: NEGATIVE
Ketones, UA: NEGATIVE
Leukocytes, UA: NEGATIVE
NITRITE UA: NEGATIVE
Protein, UA: NEGATIVE
Specific Gravity, UA: 1.02 (ref 1.005–1.030)
Urobilinogen, Ur: 0.2 mg/dL (ref 0.2–1.0)
pH, UA: 6 (ref 5.0–7.5)

## 2017-04-09 LAB — MICROSCOPIC EXAMINATION: BACTERIA UA: NONE SEEN

## 2017-04-09 LAB — BLADDER SCAN AMB NON-IMAGING

## 2017-04-09 NOTE — Patient Instructions (Addendum)
Hematuria, Adult Hematuria is blood in your urine. It can be caused by a bladder infection, kidney infection, prostate infection, kidney stone, or cancer of your urinary tract. Infections can usually be treated with medicine, and a kidney stone usually will pass through your urine. If neither of these is the cause of your hematuria, further workup to find out the reason may be needed. It is very important that you tell your health care provider about any blood you see in your urine, even if the blood stops without treatment or happens without causing pain. Blood in your urine that happens and then stops and then happens again can be a symptom of a very serious condition. Also, pain is not a symptom in the initial stages of many urinary cancers. Follow these instructions at home:  Drink lots of fluid, 3-4 quarts a day. If you have been diagnosed with an infection, cranberry juice is especially recommended, in addition to large amounts of water.  Avoid caffeine, tea, and carbonated beverages because they tend to irritate the bladder.  Avoid alcohol because it may irritate the prostate.  Take all medicines as directed by your health care provider.  If you were prescribed an antibiotic medicine, finish it all even if you start to feel better.  If you have been diagnosed with a kidney stone, follow your health care provider's instructions regarding straining your urine to catch the stone.  Empty your bladder often. Avoid holding urine for long periods of time.  After a bowel movement, women should cleanse front to back. Use each tissue only once.  Empty your bladder before and after sexual intercourse if you are a male. Contact a health care provider if:  You develop back pain.  You have a fever.  You have a feeling of sickness in your stomach (nausea) or vomiting.  Your symptoms are not better in 3 days. Return sooner if you are getting worse. Get help right away if:  You develop  severe vomiting and are unable to keep the medicine down.  You develop severe back or abdominal pain despite taking your medicines.  You begin passing a large amount of blood or clots in your urine.  You feel extremely weak or faint, or you pass out. This information is not intended to replace advice given to you by your health care provider. Make sure you discuss any questions you have with your health care provider. Document Released: 01/07/2005 Document Revised: 06/15/2015 Document Reviewed: 09/07/2012 Elsevier Interactive Patient Education  2017 [employer]  Cystoscopy Cystoscopy is a procedure that is used to help diagnose and sometimes treat conditions that affect that lower urinary tract. The lower urinary tract includes the bladder and the tube that drains urine from the bladder out of the body (urethra). Cystoscopy is performed with a thin, tube-shaped instrument with a light and camera at the end (cystoscope). The cystoscope may be hard (rigid) or flexible, depending on the goal of the procedure.The cystoscope is inserted through the urethra, into the bladder. Cystoscopy may be recommended if you have:  Urinary tractinfections that keep coming back (recurring).  Blood in the urine (hematuria).  Loss of bladder control (urinary incontinence) or an overactive bladder.  Unusual cells found in a urine sample.  A blockage in the urethra.  Painful urination.  An abnormality in the bladder found during an intravenous pyelogram (IVP) or CT scan.  Cystoscopy may also be done to remove a sample of tissue to be examined under a microscope (biopsy). Tell   a health care provider about:  Any allergies you have.  All medicines you are taking, including vitamins, herbs, eye drops, creams, and over-the-counter medicines.  Any problems you or family members have had with anesthetic medicines.  Any blood disorders you have.  Any surgeries you have had.  Any medical conditions you  have.  Whether you are pregnant or may be pregnant. What are the risks? Generally, this is a safe procedure. However, problems may occur, including:  Infection.  Bleeding.  Allergic reactions to medicines.  Damage to other structures or organs.  What happens before the procedure?  Ask your health care provider about: ? Changing or stopping your regular medicines. This is especially important if you are taking diabetes medicines or blood thinners. ? Taking medicines such as aspirin and ibuprofen. These medicines can thin your blood. Do not take these medicines before your procedure if your health care provider instructs you not to.  Follow instructions from your health care provider about eating or drinking restrictions.  You may be given antibiotic medicine to help prevent infection.  You may have an exam or testing, such as X-rays of the bladder, urethra, or kidneys.  You may have urine tests to check for signs of infection.  Plan to have someone take you home after the procedure. What happens during the procedure?  To reduce your risk of infection,your health care team will wash or sanitize their hands.  You will be given one or more of the following: ? A medicine to help you relax (sedative). ? A medicine to numb the area (local anesthetic).  The area around the opening of your urethra will be cleaned.  The cystoscope will be passed through your urethra into your bladder.  Germ-free (sterile)fluid will flow through the cystoscope to fill your bladder. The fluid will stretch your bladder so that your surgeon can clearly examine your bladder walls.  The cystoscope will be removed and your bladder will be emptied. The procedure may vary among health care providers and hospitals. What happens after the procedure?  You may have some soreness or pain in your abdomen and urethra. Medicines will be available to help you.  You may have some blood in your urine.  Do not  drive for 24 hours if you received a sedative. This information is not intended to replace advice given to you by your health care provider. Make sure you discuss any questions you have with your health care provider. Document Released: 01/05/2000 Document Revised: 05/18/2015 Document Reviewed: 11/24/2014 Elsevier Interactive Patient Education  2018 [employer] Inc.  

## 2017-04-12 LAB — CULTURE, URINE COMPREHENSIVE

## 2017-04-12 IMAGING — CT CT ABD-PEL WO/W CM
1 of 4 series · 8 of 32 positions shown, 13 images · IV contrast (isovue)
Comparison: CT the abdomen and pelvis 04/28/2013.

CLINICAL DATA: 70-year-old male with history of gross hematuria
originally recognized in April 2013, with repeat episode last week.
Some associated dysuria.

EXAM:
CT ABDOMEN AND PELVIS WITHOUT AND WITH CONTRAST
TECHNIQUE: Multidetector CT imaging of the abdomen and pelvis was performed
following the standard protocol before and following the bolus
administration of intravenous contrast.
CONTRAST:  125 mL of Isovue 370.

[Series 12: axial delay · axial · delayed · 0.85mm/px · z∈[-1096,-610]mm · 8 of 125 slices shown, 13 images]
[im 14/125  soft-tissue]
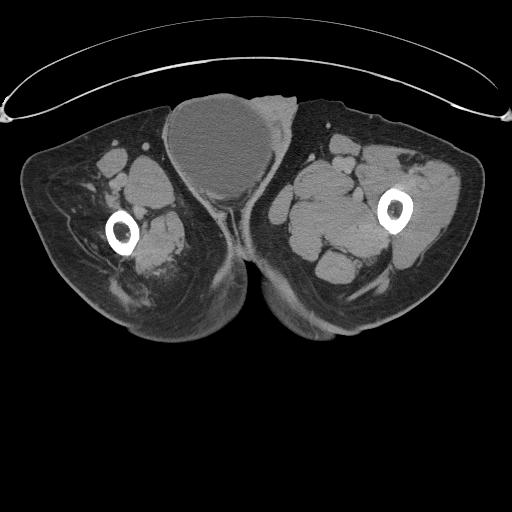
[im 14/125  bone]
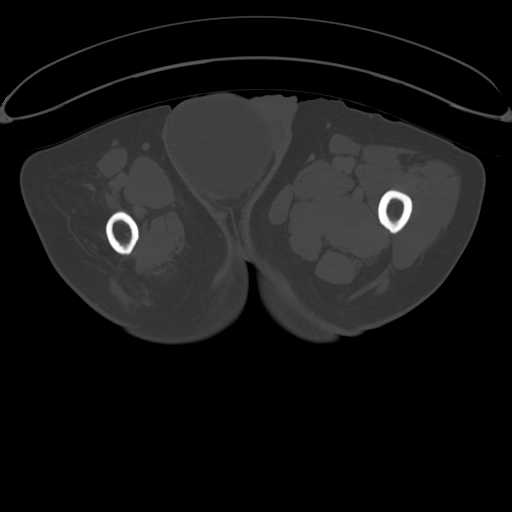
[im 28/125  soft-tissue]
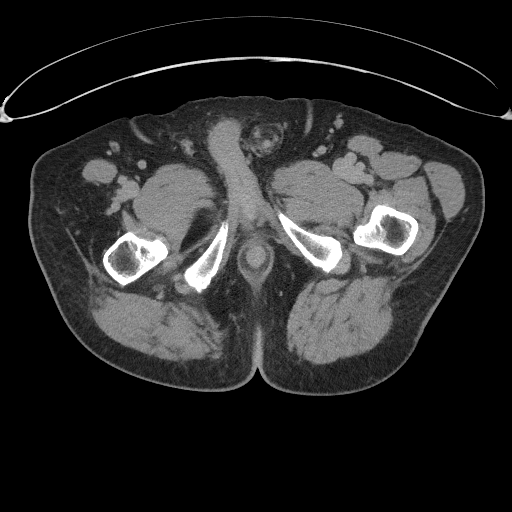
[im 42/125  soft-tissue]
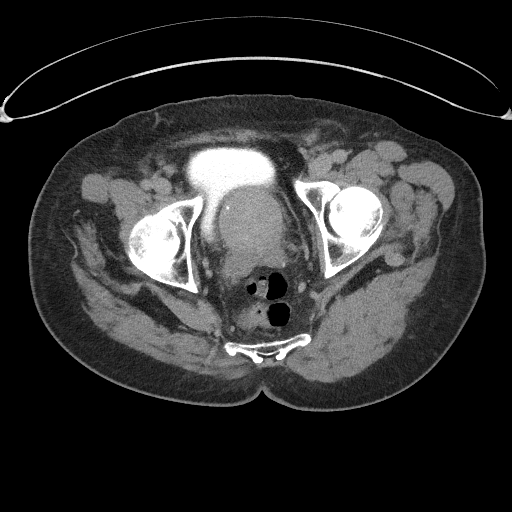
[im 56/125  soft-tissue]
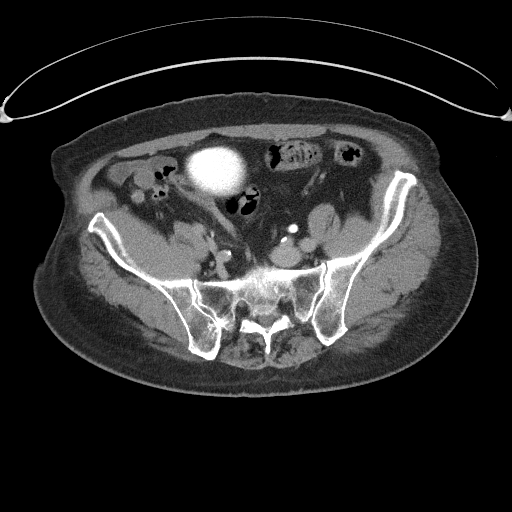
[im 69/125  soft-tissue]
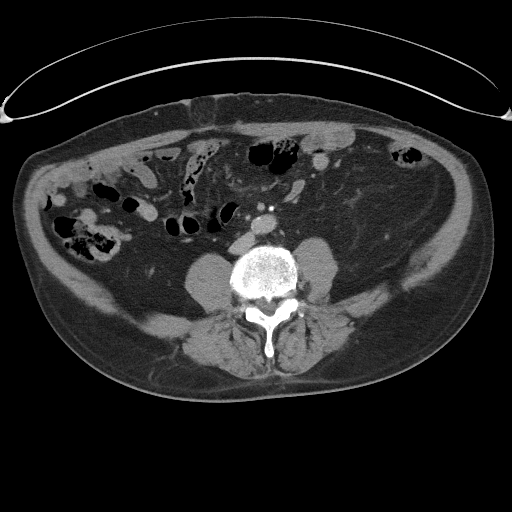
[im 69/125  lung]
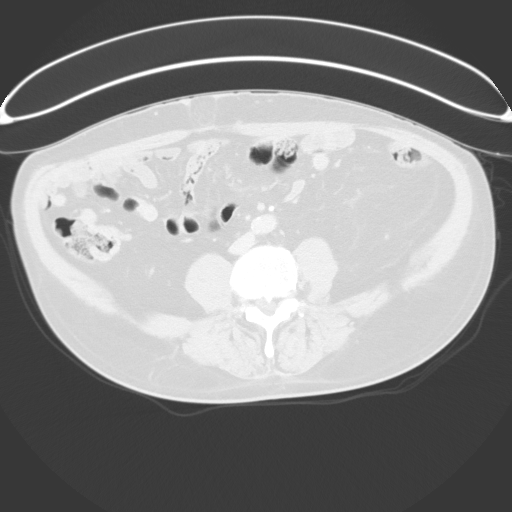
[im 83/125  soft-tissue]
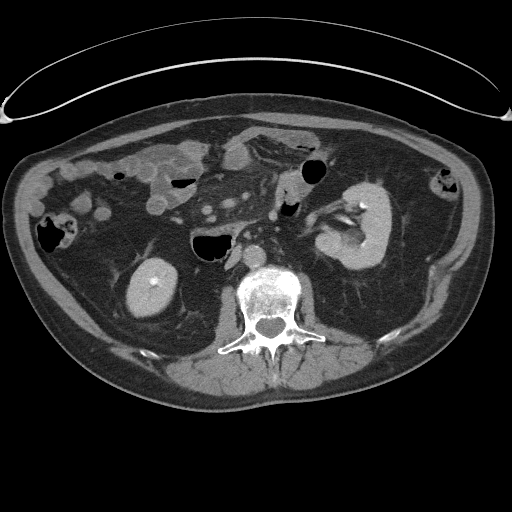
[im 83/125  lung]
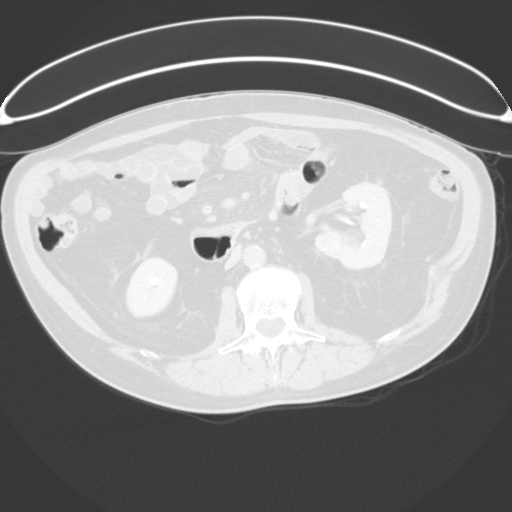
[im 97/125  soft-tissue]
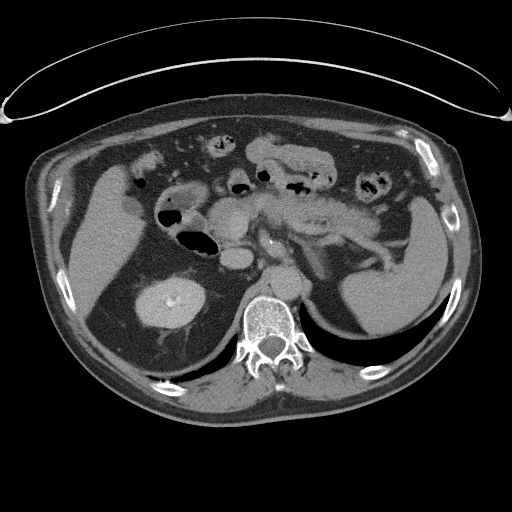
[im 97/125  lung]
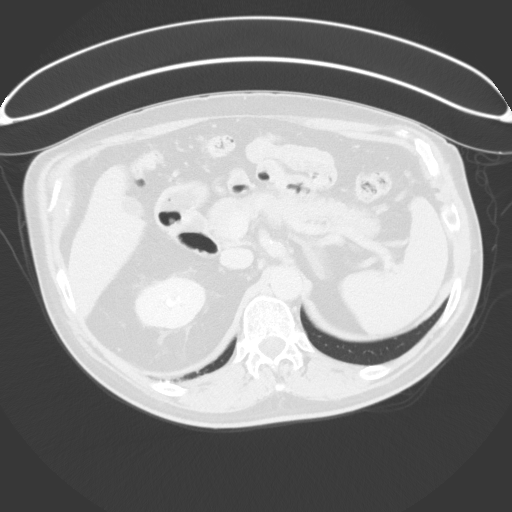
[im 111/125  soft-tissue]
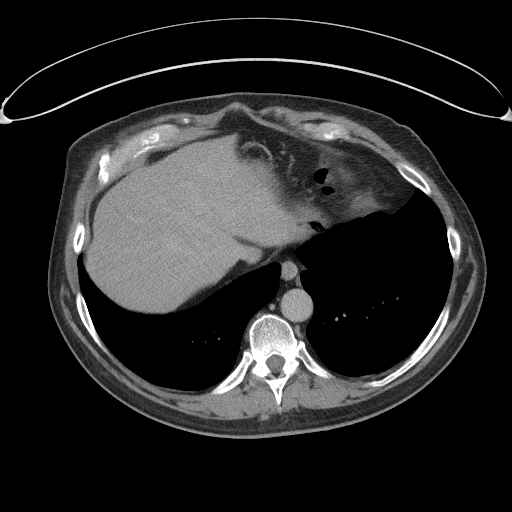
[im 111/125  lung]
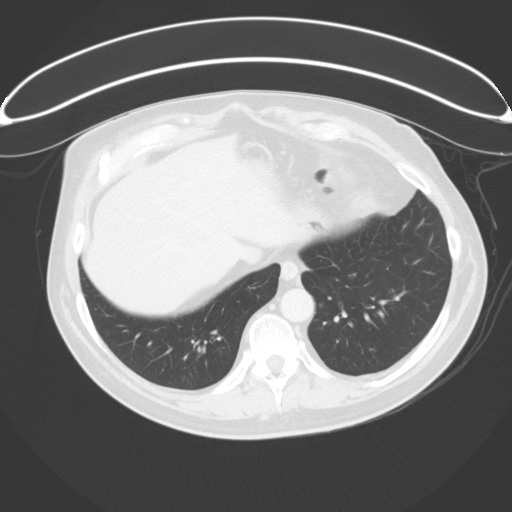

[8 of 32 positions shown; findings below may reference images not displayed]

FINDINGS: Lower chest: Unremarkable.

Hepatobiliary: Mild diffuse low attenuation throughout the hepatic
parenchyma, compatible with mild hepatic steatosis. No suspicious
cystic or solid hepatic lesions. Tiny calcified granuloma in segment
7 of the liver incidentally noted. No intra or extrahepatic biliary
ductal dilatation. Gallbladder is normal in appearance.

Pancreas: No pancreatic mass. No pancreatic ductal dilatation. No
pancreatic or peripancreatic fluid or inflammatory changes.

Spleen: Unremarkable.

Adrenals/Urinary Tract: No calcifications are identified within the
collecting system of either kidney, along the course of either
ureter, or within the lumen of the urinary bladder. Sub cm
low-attenuation lesions in the kidneys bilaterally are too small to
definitively characterize, but are favored to represent tiny cysts.
Larger well-defined low-attenuation nonenhancing lesions in the left
kidney are compatible with simple cysts, measuring up to 3 cm in the
upper pole. No suspicious renal lesions are noted. On postcontrast
images there are no filling defects within the collecting system of
either kidney, along the course of either ureter, or within the
lumen of the urinary bladder to strongly suggest the presence of a
urothelial neoplasm at this time. Urinary bladder is normal in
appearance. Bilateral adrenal glands are normal in appearance.

Stomach/Bowel: The appearance of the stomach is normal. No
pathologic dilatation of small bowel or colon. A few scattered
colonic diverticulae are noted, without surrounding inflammatory
changes to suggest an acute diverticulitis at this time. Normal
appendix.

Vascular/Lymphatic: Aortic atherosclerosis, without evidence of
aneurysm or dissection in the abdominal or pelvic vasculature. No
lymphadenopathy noted in the abdomen or pelvis.

Reproductive: Large right-sided hydrocele noted in the scrotum,
measuring up to 11.4 x 9.7 cm. Prostate gland is enlarged measuring
5.4 x 5.6 x 7.5 cm, with severe median lobe hypertrophy. Seminal
vesicles are unremarkable in appearance.

Other: Left inguinal hernia containing only fat. There is also a
tiny umbilical hernia containing only fat. No significant volume of
ascites. No pneumoperitoneum.

Musculoskeletal: 14 mm well-defined sclerotic lesion with narrow
zone of transition in the sacrum at the level of S2, similar to
prior examinations, as are several other similar appearing lesions,
largest of which is in the posterior aspect of the T9 vertebral
body, most compatible with small bone islands. No aggressive
appearing lytic or blastic lesions are otherwise noted in the
visualized portions of the skeleton.
IMPRESSION: 1. No definite source for hematuria identified on today's
examination.
2. Prostatomegaly with severe median lobe hypertrophy.
3. Two simple cysts in the left kidney, as above. Other sub cm
low-attenuation lesions in the kidneys bilaterally are too small to
definitively characterize, but are favored to represent tiny cysts.
4. Large hydrocele on the right hemiscrotum.
5. Mild hepatic steatosis.
6. Additional incidental findings, as above.
7. Small left inguinal hernia containing only fat.
8. Tiny umbilical hernia containing only fat.
9. Additional incidental findings, as above.

## 2017-04-18 ENCOUNTER — Ambulatory Visit
Admission: RE | Admit: 2017-04-18 | Discharge: 2017-04-18 | Disposition: A | Discharge status: Home / Self Care | Payer: Medicare Other | Source: Ambulatory Visit | Attending: Urology | Admitting: Urology

## 2017-04-18 DIAGNOSIS — R3129 Other microscopic hematuria: Secondary | ICD-10-CM | POA: Diagnosis not present

## 2017-04-18 DIAGNOSIS — N400 Enlarged prostate without lower urinary tract symptoms: Secondary | ICD-10-CM | POA: Diagnosis not present

## 2017-04-18 DIAGNOSIS — N281 Cyst of kidney, acquired: Secondary | ICD-10-CM | POA: Diagnosis not present

## 2017-04-23 ENCOUNTER — Ambulatory Visit (INDEPENDENT_AMBULATORY_CARE_PROVIDER_SITE_OTHER): Payer: Medicare Other | Admitting: Urology

## 2017-04-23 ENCOUNTER — Encounter: Payer: Self-pay | Admitting: Urology

## 2017-04-23 VITALS — BP 169/98 | HR 86 | Resp 16 | Ht 69.0 in | Wt 238.2 lb

## 2017-04-23 DIAGNOSIS — R3129 Other microscopic hematuria: Secondary | ICD-10-CM

## 2017-04-23 LAB — URINALYSIS, COMPLETE
BILIRUBIN UA: NEGATIVE
Glucose, UA: NEGATIVE
Leukocytes, UA: NEGATIVE
Nitrite, UA: NEGATIVE
PROTEIN UA: NEGATIVE
SPEC GRAV UA: 1.02 (ref 1.005–1.030)
Urobilinogen, Ur: 0.2 mg/dL (ref 0.2–1.0)
pH, UA: 6.5 (ref 5.0–7.5)

## 2017-04-23 LAB — MICROSCOPIC EXAMINATION
BACTERIA UA: NONE SEEN
Epithelial Cells (non renal): NONE SEEN /hpf (ref 0–10)
WBC, UA: NONE SEEN /hpf (ref 0–5)

## 2017-04-23 MED ORDER — LIDOCAINE HCL 2 % EX GEL
1.0000 "application " | Freq: Once | CUTANEOUS | Status: AC
  Administered 2017-04-23: 1 via URETHRAL

## 2017-04-23 MED ORDER — MIRABEGRON ER 50 MG PO TB24: 50.0000 mg | ORAL_TABLET | Freq: Every day | ORAL | 0 refills | Status: DC

## 2017-04-23 MED ORDER — CIPROFLOXACIN HCL 500 MG PO TABS
500.0000 mg | ORAL_TABLET | Freq: Once | ORAL | Status: AC
  Administered 2017-04-23: 500 mg via ORAL

## 2017-04-23 NOTE — Progress Notes (Signed)
   04/23/17  CC: No chief complaint on file.   HPI: 72 year old male with BPH and lower urinary tract symptoms with microhematuria and previous negative evaluations x2.  See Carollee HerterShannon McGowan's note dated 04/09/2017.  Renal ultrasound showed 2 simple cyst in the left upper pole measuring approximately 3 mm.  Prostate volume was estimated at 79 cc.  No hydronephrosis, calculi or solid mass was identified.  Blood pressure (!) 169/98, pulse 86, resp. rate 16, height 5\' 9"  (1.753 m), weight 238 lb 3.2 oz (108 kg), SpO2 98 %. NED. A&Ox3.   No respiratory distress   Abd soft, NT, ND Normal phallus with bilateral descended testicles  Cystoscopy Procedure Note  Patient identification was confirmed, informed consent was obtained, and patient was prepped using Betadine solution.  Lidocaine jelly was administered per urethral meatus.    Preoperative abx where received prior to procedure.     Pre-Procedure: - Inspection reveals a normal caliber ureteral meatus.  Procedure: The flexible cystoscope was introduced without difficulty - No urethral strictures/lesions are present. - Coapting lateral lobes with prominent hypervascularity and a prostatic urethral length of approximately 5 cm.  - Elevated bladder neck - Bilateral ureteral orifices identified - Bladder mucosa  reveals no ulcers, tumors, or lesions - No bladder stones -Mild to moderate trabeculation  Retroflexion shows no intravesical median lobe   Post-Procedure: - Patient tolerated the procedure well  Assessment/ Plan: No significant abnormalities on cystoscopy.  Prominent prostatic enlargement with hypervascularity which is the most likely etiology of his microhematuria.  He has had worsening lower urinary tract symptoms.  He is on finasteride.  His bothersome symptoms are frequency and urgency which have been intermittent.  I discussed a trial of Myrbetriq.  Outlet surgery and uro-lift was also discussed.  He was provided  literature on uro-lift and given samples of Myrbetriq 50 mg.

## 2017-06-05 ENCOUNTER — Encounter: Payer: Self-pay | Admitting: Urology

## 2017-06-05 ENCOUNTER — Other Ambulatory Visit: Payer: Self-pay | Admitting: Family Medicine

## 2017-06-05 MED ORDER — MIRABEGRON ER 50 MG PO TB24: 50.0000 mg | ORAL_TABLET | Freq: Every day | ORAL | 5 refills | Status: DC

## 2017-06-22 IMAGING — US US ART/VEN ABD/PELV/SCROTUM DOPPLER LTD
1 series · 14 of 25 positions shown · non-contrast
Comparison: 04/22/2014.

CLINICAL DATA: Hydrocele.

EXAM:
SCROTAL ULTRASOUND
DOPPLER ULTRASOUND OF THE TESTICLES
TECHNIQUE: Complete ultrasound examination of the testicles, epididymis, and
other scrotal structures was performed. Color and spectral Doppler
ultrasound were also utilized to evaluate blood flow to the
testicles.

[Series 1: us art/ven abd/pelv/scrotum doppler ltd · 0.12mm/px · 14 of 65 slices shown]
[im 1/65]
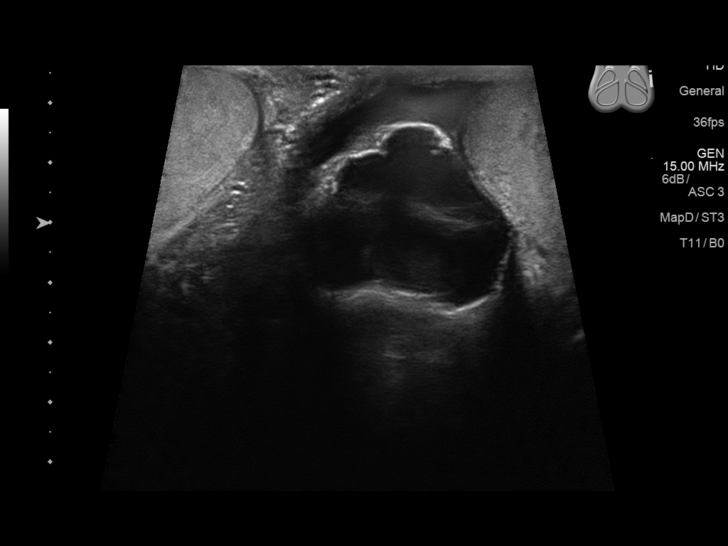
[im 6/65]
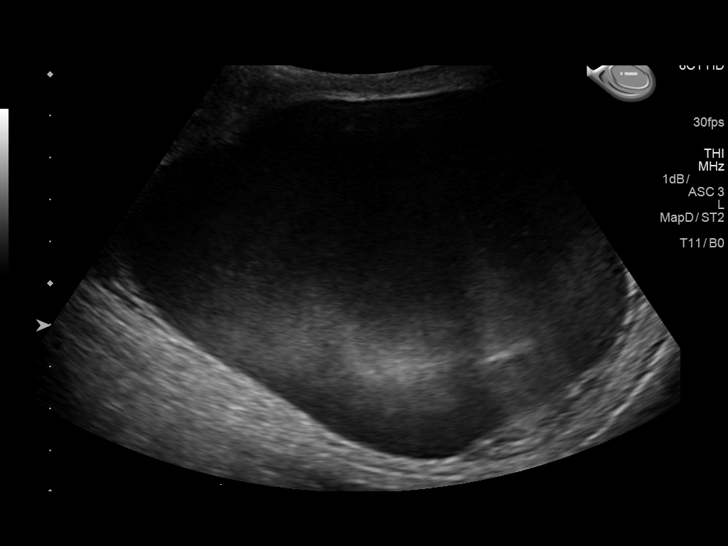
[im 11/65]
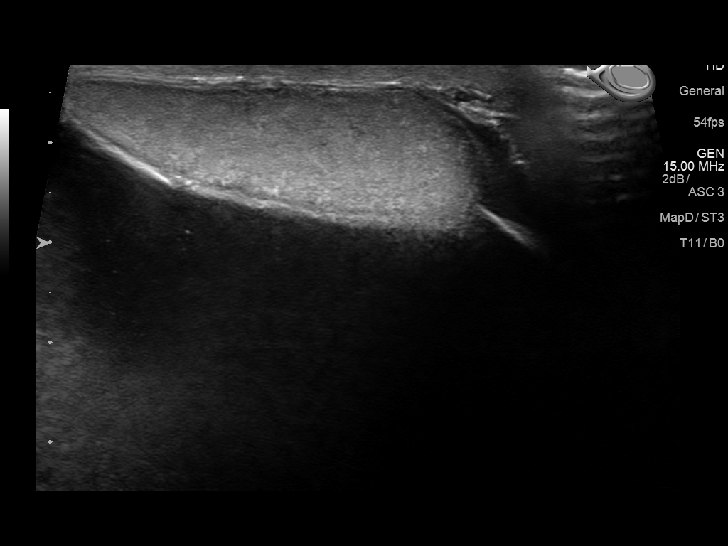
[im 17/65]
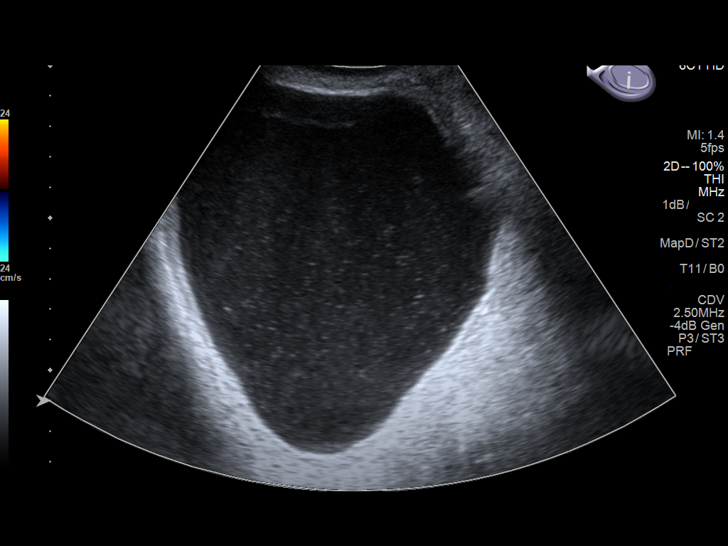
[im 22/65]
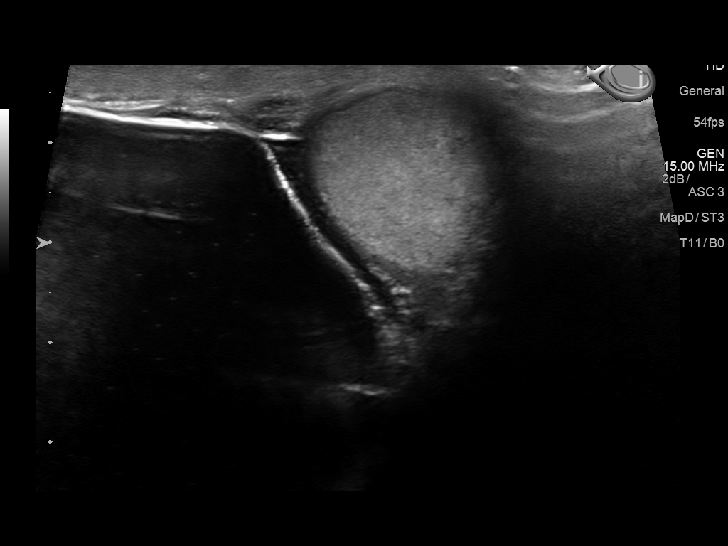
[im 25/65]
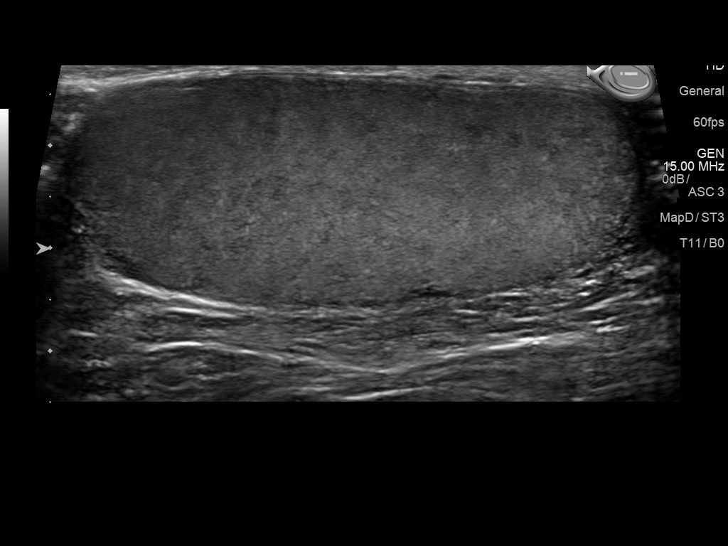
[im 30/65]
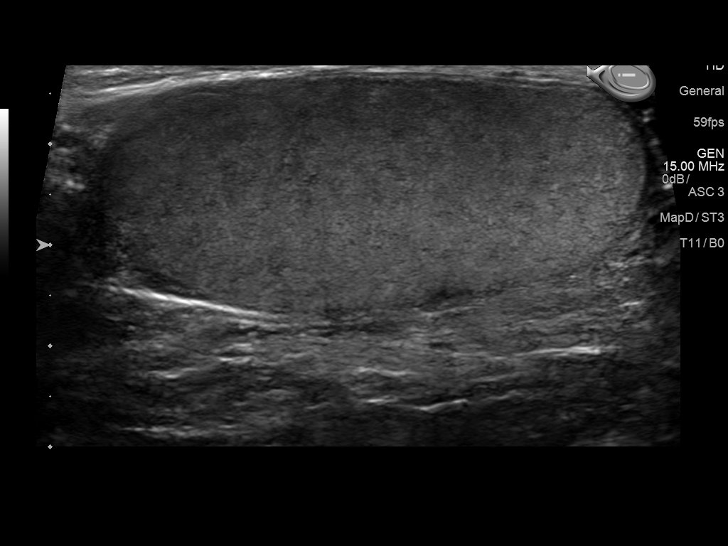
[im 35/65]
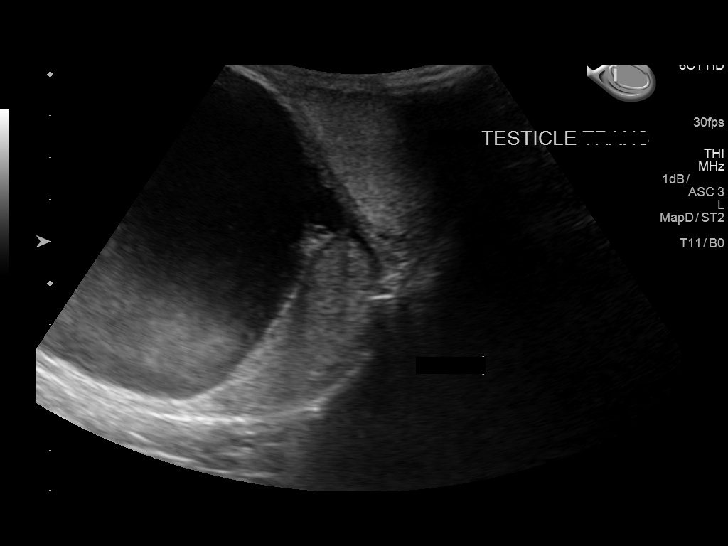
[im 41/65]
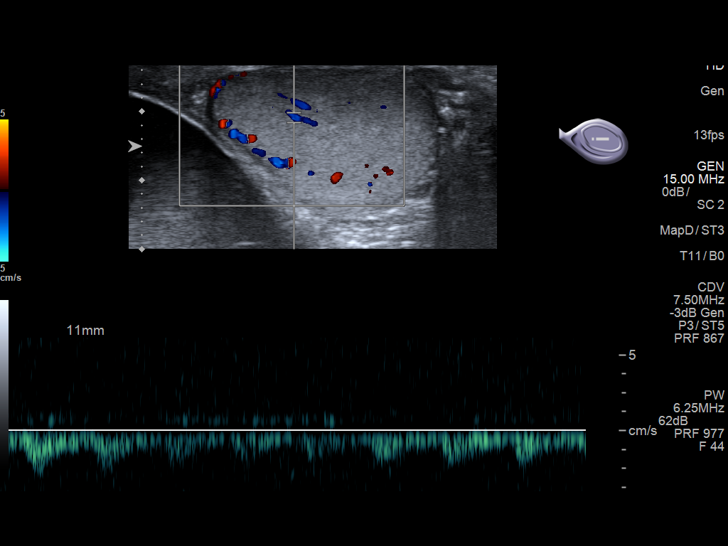
[im 43/65]
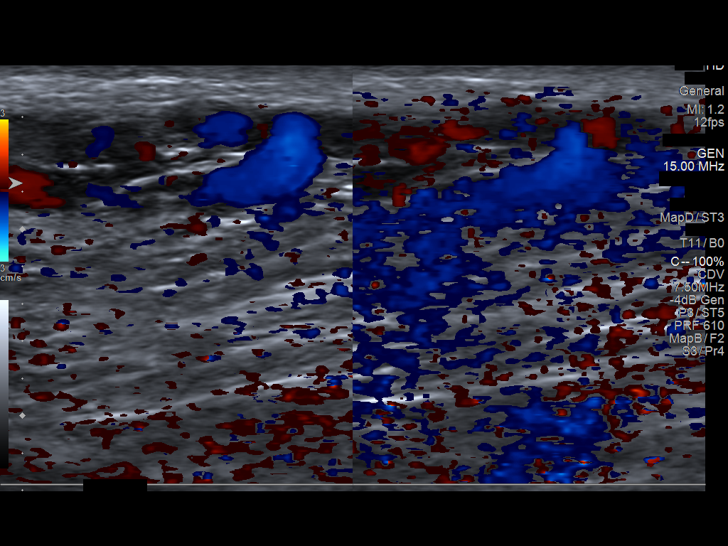
[im 49/65]
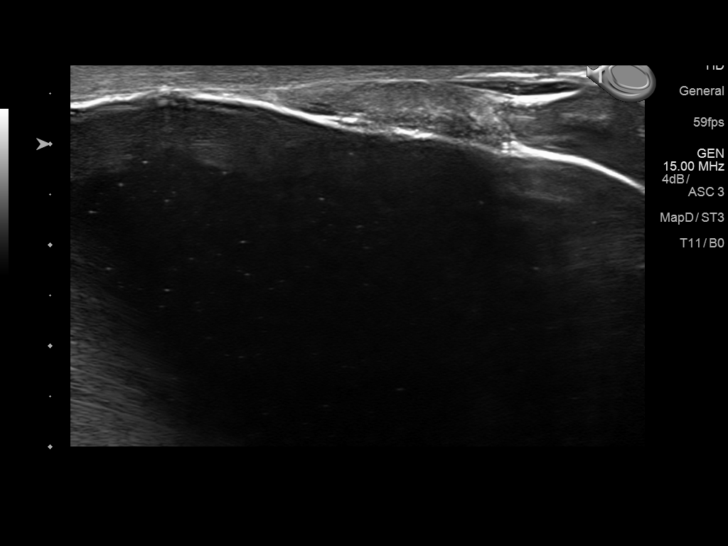
[im 54/65]
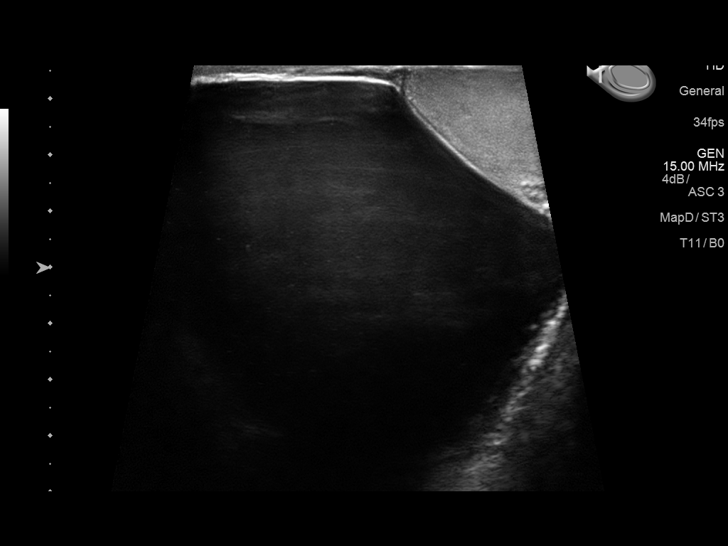
[im 59/65]
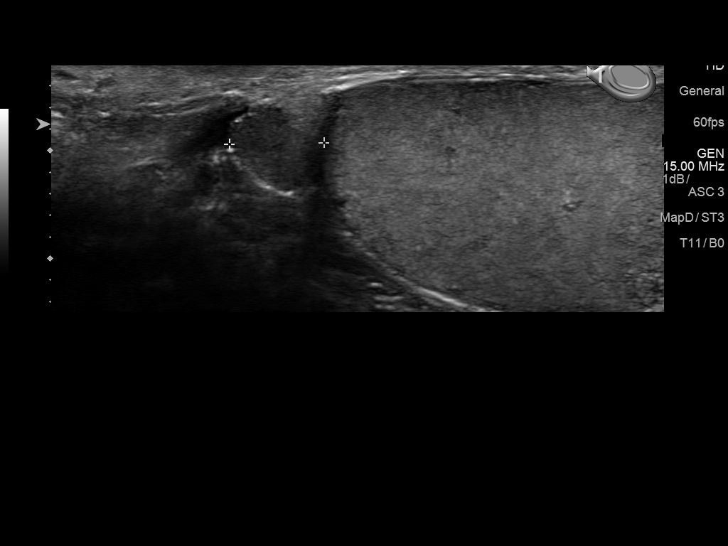
[im 65/65]
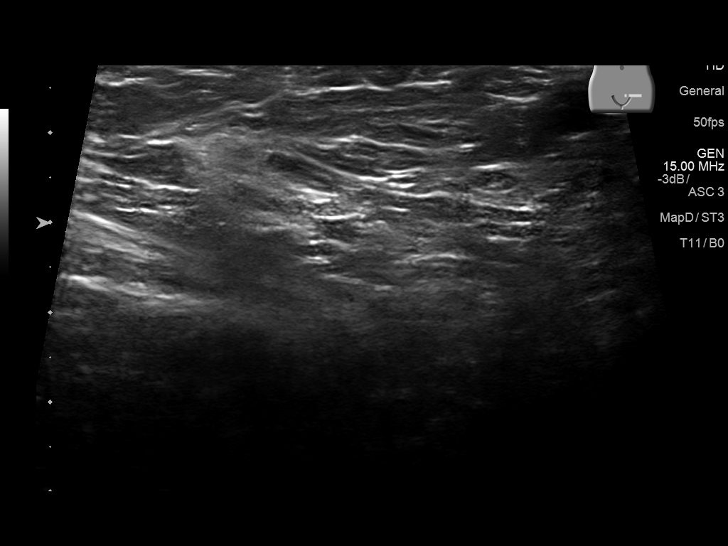

[14 of 25 positions shown; findings below may reference images not displayed]

FINDINGS: Right testicle

Measurements: 5.8 x 2.8 x 3.8 cm. No mass or microlithiasis
visualized.

Left testicle

Measurements: 5.7 x 2.3 x 3.8 cm. No mass or microlithiasis
visualized.

Right epididymis:  Normal in size and appearance.

Left epididymis:  Normal in size and appearance.

Hydrocele: 11.9 x 9.2 x 10.7 cm right hydrocele noted. This is
increased in size from prior exam.

Varicocele:  None visualized.

Pulsed Doppler interrogation of both testes demonstrates normal low
resistance arterial and venous waveforms bilaterally.
IMPRESSION: 11.9 x 9.2 x 10.7 cm right hydrocele noted. This has increased in
size from prior exam. No other focal abnormality identified.

## 2017-07-03 ENCOUNTER — Telehealth: Payer: Self-pay | Admitting: Urology

## 2017-07-03 MED ORDER — TAMSULOSIN HCL 0.4 MG PO CAPS: 0.4000 mg | ORAL_CAPSULE | Freq: Every day | ORAL | 3 refills | Status: DC

## 2017-07-03 NOTE — Telephone Encounter (Signed)
Spoke w/ pt about his concerns. He states he was offered flomax before, since myrbetriq is to expensive, he wants to try flomax. [facility] Dr[facility] Cobb[facility]ioff, [facility]

## 2017-07-03 NOTE — Telephone Encounter (Signed)
Patient is requesting a call back to discuss medications.    Stoioff prescribed Myrbetriq and patient states medication is expensive and needs some options.  He can be reached at [phone].

## 2017-09-27 ENCOUNTER — Other Ambulatory Visit: Payer: Self-pay | Admitting: Family Medicine

## 2017-09-27 DIAGNOSIS — I10 Essential (primary) hypertension: Secondary | ICD-10-CM

## 2017-09-27 DIAGNOSIS — E785 Hyperlipidemia, unspecified: Secondary | ICD-10-CM

## 2017-09-29 ENCOUNTER — Ambulatory Visit: Payer: Medicare Other | Admitting: Family Medicine

## 2017-10-23 ENCOUNTER — Ambulatory Visit (INDEPENDENT_AMBULATORY_CARE_PROVIDER_SITE_OTHER): Payer: Medicare Other | Admitting: Family Medicine

## 2017-10-23 ENCOUNTER — Encounter: Payer: Self-pay | Admitting: Family Medicine

## 2017-10-23 VITALS — BP 136/72 | HR 93 | Temp 98.4°F | Resp 16 | Ht 69.0 in | Wt 246.7 lb

## 2017-10-23 DIAGNOSIS — G14 Postpolio syndrome: Secondary | ICD-10-CM | POA: Diagnosis not present

## 2017-10-23 DIAGNOSIS — D692 Other nonthrombocytopenic purpura: Secondary | ICD-10-CM

## 2017-10-23 DIAGNOSIS — E669 Obesity, unspecified: Secondary | ICD-10-CM

## 2017-10-23 DIAGNOSIS — Z23 Encounter for immunization: Secondary | ICD-10-CM | POA: Diagnosis not present

## 2017-10-23 DIAGNOSIS — G4733 Obstructive sleep apnea (adult) (pediatric): Secondary | ICD-10-CM

## 2017-10-23 DIAGNOSIS — Z0000 Encounter for general adult medical examination without abnormal findings: Secondary | ICD-10-CM | POA: Diagnosis not present

## 2017-10-23 DIAGNOSIS — E559 Vitamin D deficiency, unspecified: Secondary | ICD-10-CM

## 2017-10-23 DIAGNOSIS — E538 Deficiency of other specified B group vitamins: Secondary | ICD-10-CM | POA: Diagnosis not present

## 2017-10-23 DIAGNOSIS — R2681 Unsteadiness on feet: Secondary | ICD-10-CM

## 2017-10-23 DIAGNOSIS — E785 Hyperlipidemia, unspecified: Secondary | ICD-10-CM

## 2017-10-23 DIAGNOSIS — I10 Essential (primary) hypertension: Secondary | ICD-10-CM | POA: Diagnosis not present

## 2017-10-23 MED ORDER — ATORVASTATIN CALCIUM 40 MG PO TABS: 40.0000 mg | ORAL_TABLET | Freq: Every day | ORAL | 1 refills | Status: DC

## 2017-10-23 MED ORDER — AMLODIPINE BESY-BENAZEPRIL HCL 2.5-10 MG PO CAPS: 1.0000 | ORAL_CAPSULE | Freq: Every day | ORAL | 1 refills | Status: DC

## 2017-10-23 NOTE — Patient Instructions (Signed)
Check with insurance if they pay for Shingrix and Tdap   Preventive Care 81 Years and Older, Male Preventive care refers to lifestyle choices and visits with your health care provider that can promote health and wellness. What does preventive care include?  A yearly physical exam. This is also called an annual well check.  Dental exams once or twice a year.  Routine eye exams. Ask your health care provider how often you should have your eyes checked.  Personal lifestyle choices, including: ? Daily care of your teeth and gums. ? Regular physical activity. ? Eating a healthy diet. ? Avoiding tobacco and drug use. ? Limiting alcohol use. ? Practicing safe sex. ? Taking low doses of aspirin every day. ? Taking vitamin and mineral supplements as recommended by your health care provider. What happens during an annual well check? The services and screenings done by your health care provider during your annual well check will depend on your age, overall health, lifestyle risk factors, and family history of disease. Counseling Your health care provider may ask you questions about your:  Alcohol use.  Tobacco use.  Drug use.  Emotional well-being.  Home and relationship well-being.  Sexual activity.  Eating habits.  History of falls.  Memory and ability to understand (cognition).  Work and work Statistician.  Screening You may have the following tests or measurements:  Height, weight, and BMI.  Blood pressure.  Lipid and cholesterol levels. These may be checked every 5 years, or more frequently if you are over 33 years old.  Skin check.  Lung cancer screening. You may have this screening every year starting at age 29 if you have a 30-pack-year history of smoking and currently smoke or have quit within the past 15 years.  Fecal occult blood test (FOBT) of the stool. You may have this test every year starting at age 66.  Flexible sigmoidoscopy or colonoscopy. You may  have a sigmoidoscopy every 5 years or a colonoscopy every 10 years starting at age 8.  Prostate cancer screening. Recommendations will vary depending on your family history and other risks.  Hepatitis C blood test.  Hepatitis B blood test.  Sexually transmitted disease (STD) testing.  Diabetes screening. This is done by checking your blood sugar (glucose) after you have not eaten for a while (fasting). You may have this done every 1-3 years.  Abdominal aortic aneurysm (AAA) screening. You may need this if you are a current or former smoker.  Osteoporosis. You may be screened starting at age 56 if you are at high risk.  Talk with your health care provider about your test results, treatment options, and if necessary, the need for more tests. Vaccines Your health care provider may recommend certain vaccines, such as:  Influenza vaccine. This is recommended every year.  Tetanus, diphtheria, and acellular pertussis (Tdap, Td) vaccine. You may need a Td booster every 10 years.  Varicella vaccine. You may need this if you have not been vaccinated.  Zoster vaccine. You may need this after age 38.  Measles, mumps, and rubella (MMR) vaccine. You may need at least one dose of MMR if you were born in 1957 or later. You may also need a second dose.  Pneumococcal 13-valent conjugate (PCV13) vaccine. One dose is recommended after age 29.  Pneumococcal polysaccharide (PPSV23) vaccine. One dose is recommended after age 72.  Meningococcal vaccine. You may need this if you have certain conditions.  Hepatitis A vaccine. You may need this if you have  certain conditions or if you travel or work in places where you may be exposed to hepatitis A.  Hepatitis B vaccine. You may need this if you have certain conditions or if you travel or work in places where you may be exposed to hepatitis B.  Haemophilus influenzae type b (Hib) vaccine. You may need this if you have certain risk factors.  Talk to  your health care provider about which screenings and vaccines you need and how often you need them. This information is not intended to replace advice given to you by your health care provider. Make sure you discuss any questions you have with your health care provider. Document Released: 02/03/2015 Document Revised: 09/27/2015 Document Reviewed: 11/08/2014 Elsevier Interactive Patient Education  [employer].

## 2017-10-23 NOTE — Progress Notes (Signed)
Patient: Shane Bowen, Male    DOB: [birth year], 72 y.o.   MRN: [id number]  Visit Date: 10/23/2017  Today's Provider: Ruel Favors, MD   Chief Complaint  Patient presents with  . Medication Refill  . Hypertension  . Dyslipidemia  . Constipation  . Sleep Apnea  . Obesity    Subjective:    HPI   Shane Bowen is a 72 y.o. male who presents today for his Subsequent Annual Wellness Visit and follow up  OSA: he wears APAPevery night. He is back on nasal mask without pillows. No headache in am  HTN: bp here is at goal, he states also at goal when checked at home. No chest pain, dizziness  or palpitation  Dyslipidemia: lipid panel looked normal, but his ASCVD was 14% back in 2017, he is on Atorvastatin, also on a healthy diet and we will recheck labs. Denies any side effects of medication. We will recheck labs today   Obesity: he has lost 64 lbs from March 2017 till March 2018, he states he started to lose weight after tooth abscess, and got motivated to stay on a diet after that ( started Dec 2016), he was feeling well. He states he had cut out carbohydrates, no desserts, smaller portions, and replaced his snacks with high protein snacks instead of chips, and he had been drinking broth late at night instead of eating. He gained weight since last visit, because he has not been as strict with his diet lately, but will try to avoid carbs a little more now   Elevated PSA history of hydrocele: seeing Urologist, last PSA is trending up   Gait instability: he is doing well , still using a cane and has a brace that is helping with his gait. Gait is stable with physical activity   Senile purpura: on left arm, patient was given reassurance   Review of Systems  Constitutional: Negative for fever , positive for mild  weight change.  Respiratory: Negative for cough and shortness of breath.   Cardiovascular: Negative for chest pain or palpitations.  Gastrointestinal: Negative for  abdominal pain, no bowel changes.  Musculoskeletal: Positive  for gait problem but no  joint swelling.  Skin: Negative for rash.  Neurological: Negative for dizziness or headache.  No other specific complaints in a complete review of systems (except as listed in HPI above).  Past Medical History:  Diagnosis Date  . Abnormal ECG   . Androgen deficiency   . Anemia   . Bilateral renal cysts   . BPH with elevated PSA   . Chronic insomnia   . Epididymal cyst   . Erectile dysfunction   . Gross hematuria   . Hepatitis    age 33 hepatitis A  . Hydrocele, right   . Hyperglycemia   . Hyperlipidemia   . Hypertension   . Incomplete bladder emptying   . Insomnia   . Lumbago   . Metabolic syndrome   . Obstructive sleep apnea    CPAP  . Over weight   . Polio   . Post-polio syndrome   . Prostate enlargement   . Scrotal pain   . Vitamin D deficiency     Past Surgical History:  Procedure Laterality Date  . ANKLE FUSION Right 05/2009  . COLONOSCOPY    . COLONOSCOPY WITH PROPOFOL N/A 04/27/2015   Procedure: COLONOSCOPY WITH PROPOFOL;  Surgeon: Midge Minium, MD;  Location: [facility];  Service: Endoscopy;  Laterality: N/A;  CPAP  .  HYDROCELE EXCISION Right 01/29/2016   Procedure: HYDROCELECTOMY ADULT;  Surgeon: Vanna Scotland, MD;  Location: [facility];  Service: Urology;  Laterality: Right;  . HYDROCELE EXCISION Right 04/29/2016   Procedure: HYDROCELECTOMY ADULT;  Surgeon: Vanna Scotland, MD;  Location: [facility];  Service: Urology;  Laterality: Right;  . INGUINAL HERNIA REPAIR    . LEG SURGERY    . LUMBAR LAMINECTOMY    . Right leg surgery     from polio age 38  . TENDON GRAFT Right 2009   Hand after Lacertion Injury     Family History  Problem Relation Age of Onset  . Hyperlipidemia Mother   . Heart disease Father   . Heart disease Sister        ATF  . Prostate cancer Neg Hx   . Kidney disease Neg Hx   . Kidney cancer Neg Hx   . Bladder Cancer Neg Hx     Social History    Socioeconomic History  . Marital status: Widowed    Spouse name: Not on file  . Number of children: 2  . Years of education: Not on file  . Highest education level: Bachelor's degree (e.g., BA, AB, BS)  Occupational History  . 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 Financial resource strain: Not hard at all  . Food insecurity:    Worry: Never true    Inability: Never true  . Transportation needs:    Medical: No    Non-medical: No  Tobacco Use  . Smoking status: Former Smoker    Packs/day: 1.50    Years: 25.00    Pack years: 37.50    Types: Cigarettes    Start date: 01/22/1968    Last attempt to quit: 01/21/1993    Years since quitting: 24.7  . Smokeless tobacco: Never Used  . Tobacco comment: smoking cessation materials not required  Substance and Sexual Activity  . Alcohol use: Yes    Alcohol/week: 7.0 standard drinks    Types: 7 Glasses of wine per week    Comment:    . Drug use: No  . Sexual activity: Not Currently  Lifestyle  . Physical activity:    Days per week: 2 days    Minutes per session: 60 min  . Stress: Not at all  Relationships  . Social connections:    Talks on phone: More than three times a week    Gets together: Three times a week    Attends religious service: More than 4 times per year    Active member of club or organization: Yes    Attends meetings of clubs or organizations: More than 4 times per year    Relationship status: Widowed  . Intimate partner violence:    Fear of current or ex partner: No    Emotionally abused: No    Physically abused: No    Forced sexual activity: No  Other Topics Concern  . Not on file  Social History Narrative  . Not on file    Outpatient Encounter Medications as of 10/23/2017  Medication Sig Note  . amlodipine-benazepril (LOTREL) 2.5-10 MG capsule Take 1 capsule by mouth daily.   Marland Kitchen aspirin 81 MG tablet Take 81 mg by mouth daily.    Marland Kitchen atorvastatin (LIPITOR)  40 MG tablet Take 1 tablet (40 mg total) by mouth daily.   . Cholecalciferol (VITAMIN D) 2000 units CAPS Take 1 capsule (2,000 Units  total) by mouth daily.   Marland Kitchen docusate sodium (COLACE) 100 MG capsule Take 100 mg by mouth 2 (two) times daily as needed for mild constipation. 10/10/2016: PRN  . finasteride (PROSCAR) 5 MG tablet Take 1 tablet (5 mg total) by mouth daily.   . Melatonin 2.5 MG CAPS Take 2.5 mg by mouth at bedtime as needed (sleep). 10/10/2016: PRN  . naproxen sodium (ANAPROX) 220 MG tablet Take 220 mg by mouth 2 (two) times daily as needed (pain). 10/10/2016: PRN  . tamsulosin (FLOMAX) 0.4 MG CAPS capsule Take 1 capsule (0.4 mg total) by mouth daily.   . vitamin B-12 (CYANOCOBALAMIN) 1000 MCG tablet Take 1,000 mcg by mouth daily.   . [DISCONTINUED] amlodipine-benazepril (LOTREL) 2.5-10 MG capsule TAKE ONE CAPSULE BY MOUTH EVERY DAY   . [DISCONTINUED] atorvastatin (LIPITOR) 40 MG tablet TAKE ONE TABLET BY MOUTH EVERY DAY   . [DISCONTINUED] mirabegron ER (MYRBETRIQ) 50 MG TB24 tablet Take 1 tablet (50 mg total) by mouth daily.    No facility-administered encounter medications on file as of 10/23/2017.     Allergies  Allergen Reactions  . Duloxetine Hcl     Doesn't remember reaction    Care Team Updated in EHR: Yes  Last Vision Exam: 2019 Wears corrective lenses: Yes Last Dental Exam: goes twice a year - Dr. Daily  Last Hearing Exam:  Wears Hearing Aids: No  Functional Ability / Safety Screening 1.  Was the timed Get Up and Go test shorter than 30 seconds?  yes 2.  Does the patient need help with the phone, transportation, shopping,      preparing meals, housework, laundry, medications, or managing money?  no 3.  Is the patient's home free of loose throw rugs in walkways, pet beds, electrical cords, etc?   yes      Grab bars in the bathroom? yes      Handrails on the stairs?   N/A      Adequate lighting?   yes 4.  Has the patient noticed any hearing difficulties?   no  Diet  Recall and Exercise Regimen: he cannot exercise because of post-polio, but he eats a balanced diet, avoiding carbs and will try to not splurge on sweets as often   Advanced Care Planning: A voluntary discussion about advance care planning including the explanation and discussion of advance directives.  Discussed health care proxy and Living will, and the patient was able to identify a health care proxy as daughter .  Patient does have a living will at present time. If patient does have living will, I have requested they bring this to the clinic to be scanned in to their chart. Does patient have a HCPOA?    yes If yes, name and contact information: Dorann Ou [phone]  Does patient have a living will or MOST form?  yes  Cancer Screenings: Skin: discussed atypical lesions  Lung: Low Dose CT Chest recommended if Age 61-80 years, 30 pack-year currently smoking OR have quit w/in 15years. Patient does not qualify. Colon: up to date   Additional Screenings:  Hepatitis B/HIV/Syphillis: N/A Hepatitis C Screening: up to date  Intimate Partner Violence: lives alone, good relationship with his kids   Objective:   Vitals: BP 136/72 (BP Location: Right Arm, Patient Position: Sitting, Cuff Size: Large)   Pulse 93   Temp 98.4 F (36.9 C) (Oral)   Resp 16   Ht 5\' 9"  (1.753 m)   Wt 246 lb 11.2 oz (111.9 kg)  SpO2 97%   BMI 36.43 kg/m  Body mass index is 36.43 kg/m.  No exam data present  Physical Exam   Constitutional: Patient appears well-developed and well-nourished. Obese No distress.  HEENT: head atraumatic, normocephalic, pupils equal and reactive to light, neck supple, throat within normal limits Cardiovascular: Normal rate, regular rhythm and normal heart sounds.  No murmur heard. No BLE edema. Pulmonary/Chest: Effort normal and breath sounds normal. No respiratory distress. Abdominal: Soft.  There is no tenderness. Skin: senile purpura on left arm today  Psychiatric: Patient has  a normal mood and affect. behavior is normal. Judgment and thought content normal. Muscular skeletal: wearing braces on both knees, cane to assist with ambulation   Cognitive Testing - 6-CIT  Correct? Score   What year is it? yes 0 Yes = 0    No = 4  What month is it? yes 0 Yes = 0    No = 3  Remember:     Floyde Parkins, [address], [place]     What time is it? yes 0 Yes = 0    No = 3  Count backwards from 20 to 1 yes 0 Correct = 0    1 error = 2   More than 1 error = 4  Say the months of the year in reverse. yes 0 Correct = 0    1 error = 2   More than 1 error = 4  What address did I ask you to remember? yes 0 Correct = 0  1 error = 2    2 error = 4    3 error = 6    4 error = 8    All wrong = 10       TOTAL SCORE  0/28   Interpretation:  Normal  Normal (0-7) Abnormal (8-28)   Fall Risk: Fall Risk  10/23/2017 03/27/2017 10/03/2016 04/01/2016 10/03/2015  Falls in the past year? No Yes No No No  Number falls in past yr: - 1 - - -  Injury with Fall? - No - - -  Risk for fall due to : - Impaired balance/gait - - -  Follow up - Falls prevention discussed - - -    Depression Screen Depression screen [facility] 2/9 10/23/2017 03/27/2017 10/03/2016 04/01/2016 10/03/2015  Decreased Interest 0 0 0 0 0  Down, Depressed, Hopeless 0 0 0 0 0  PHQ - 2 Score 0 0 0 0 0  Altered sleeping 1 - - - -  Tired, decreased energy 1 - - - -  Change in appetite 0 - - - -  Feeling bad or failure about yourself  0 - - - -  Trouble concentrating 0 - - - -  Moving slowly or fidgety/restless 0 - - - -  Suicidal thoughts 0 - - - -  PHQ-9 Score 2 - - - -  Difficult doing work/chores Not difficult at all - - - -    No results found for this or any previous visit (from the past 2160 hour(s)).  Assessment & Plan:    1. Medicare annual wellness visit, subsequent   2. Need for immunization against influenza  - Flu vaccine HIGH DOSE PF (Fluzone High dose)  3. Essential (primary) hypertension  - CBC with  Differential/Platelet - COMPLETE METABOLIC PANEL WITH GFR - amlodipine-benazepril (LOTREL) 2.5-10 MG capsule; Take 1 capsule by mouth daily.  Dispense: 90 capsule; Refill: 1  4. Dyslipidemia  - Lipid panel -  atorvastatin (LIPITOR) 40 MG tablet; Take 1 tablet (40 mg total) by mouth daily.  Dispense: 90 tablet; Refill: 1  5. Vitamin D deficiency  Continue supplementation   6. Obstructive apnea  Compliant with CPAP   7. B12 deficiency  Taking supplements a few times a week  - Vitamin B12  8. Post-polio syndrome   9. Gait instability  Using cane  10. Obesity (BMI 30-39.9)  Gaining weight again, he will try to get more strict with his diet  11. Senile purpura (HCC)  Gave reassurance    Exercise Activities and Dietary recommendations  Lose weight again  - Discussed health benefits of physical activity, and encouraged him to engage in regular exercise appropriate for his age and condition.   Immunization History  Administered Date(s) Administered  . Influenza, High Dose Seasonal PF 09/30/2014, 10/03/2015, 10/03/2016, 10/23/2017  . Influenza-Unspecified 09/28/2013  . Pneumococcal Conjugate-13 07/29/2014  . Pneumococcal Polysaccharide-23 06/22/2012  . Tdap 05/12/2007  . Zoster 10/03/2011    Health Maintenance  Topic Date Due  . Janet Berlin  05/11/2017  . COLONOSCOPY  04/26/2025  . INFLUENZA VACCINE  Completed  . Hepatitis C Screening  Completed  . PNA vac Low Risk Adult  Completed    Meds ordered this encounter  Medications  . amlodipine-benazepril (LOTREL) 2.5-10 MG capsule    Sig: Take 1 capsule by mouth daily.    Dispense:  90 capsule    Refill:  1  . atorvastatin (LIPITOR) 40 MG tablet    Sig: Take 1 tablet (40 mg total) by mouth daily.    Dispense:  90 tablet    Refill:  1    Current Outpatient Medications:  .  amlodipine-benazepril (LOTREL) 2.5-10 MG capsule, Take 1 capsule by mouth daily., Disp: 90 capsule, Rfl: 1 .  aspirin 81 MG tablet,  Take 81 mg by mouth daily. , Disp: , Rfl:  .  atorvastatin (LIPITOR) 40 MG tablet, Take 1 tablet (40 mg total) by mouth daily., Disp: 90 tablet, Rfl: 1 .  Cholecalciferol (VITAMIN D) 2000 units CAPS, Take 1 capsule (2,000 Units total) by mouth daily., Disp: 30 capsule, Rfl: 0 .  docusate sodium (COLACE) 100 MG capsule, Take 100 mg by mouth 2 (two) times daily as needed for mild constipation., Disp: , Rfl:  .  finasteride (PROSCAR) 5 MG tablet, Take 1 tablet (5 mg total) by mouth daily., Disp: 90 tablet, Rfl: 3 .  Melatonin 2.5 MG CAPS, Take 2.5 mg by mouth at bedtime as needed (sleep)., Disp: , Rfl:  .  naproxen sodium (ANAPROX) 220 MG tablet, Take 220 mg by mouth 2 (two) times daily as needed (pain)., Disp: , Rfl:  .  tamsulosin (FLOMAX) 0.4 MG CAPS capsule, Take 1 capsule (0.4 mg total) by mouth daily., Disp: 30 capsule, Rfl: 3 .  vitamin B-12 (CYANOCOBALAMIN) 1000 MCG tablet, Take 1,000 mcg by mouth daily., Disp: , Rfl:  Medications Discontinued During This Encounter  Medication Reason  . mirabegron ER (MYRBETRIQ) 50 MG TB24 tablet   . amlodipine-benazepril (LOTREL) 2.5-10 MG capsule Reorder  . atorvastatin (LIPITOR) 40 MG tablet Reorder    I have personally reviewed and addressed the Medicare Annual Wellness health risk assessment questionnaire and have noted the following in the patient's chart:  A.         Medical and social history & family history B.         Use of alcohol, tobacco, and illicit drugs  C.  Current medications and supplements D.         Functional and Cognitive ability and status E.         Nutritional status F.         Physical activity G.        Advance directives H.         List of other physicians I.          Hospitalizations, surgeries, and ER visits in previous 12 months J.         Vitals K.         Screenings such as hearing, vision, cognitive function, and depression L.         Referrals and appointments:   In addition, I have reviewed and discussed  with patient certain preventive protocols, quality metrics, and best practice recommendations. A written personalized care plan for preventive services as well as general preventive health recommendations were provided to patient.   See attached scanned questionnaire for additional information.

## 2017-10-24 LAB — CBC WITH DIFFERENTIAL/PLATELET
BASOS PCT: 0.5 %
Basophils Absolute: 33 cells/uL (ref 0–200)
EOS ABS: 221 {cells}/uL (ref 15–500)
Eosinophils Relative: 3.4 %
HEMATOCRIT: 47.5 % (ref 38.5–50.0)
HEMOGLOBIN: 15.8 g/dL (ref 13.2–17.1)
LYMPHS ABS: 1346 {cells}/uL (ref 850–3900)
MCH: 28.3 pg (ref 27.0–33.0)
MCHC: 33.3 g/dL (ref 32.0–36.0)
MCV: 85 fL (ref 80.0–100.0)
MPV: 12.2 fL (ref 7.5–12.5)
Monocytes Relative: 8.6 %
NEUTROS ABS: 4342 {cells}/uL (ref 1500–7800)
Neutrophils Relative %: 66.8 %
Platelets: 168 10*3/uL (ref 140–400)
RBC: 5.59 10*6/uL (ref 4.20–5.80)
RDW: 13.4 % (ref 11.0–15.0)
Total Lymphocyte: 20.7 %
WBC: 6.5 10*3/uL (ref 3.8–10.8)
WBCMIX: 559 {cells}/uL (ref 200–950)

## 2017-10-24 LAB — COMPLETE METABOLIC PANEL WITH GFR
AG Ratio: 1.9 (calc) (ref 1.0–2.5)
ALT: 22 U/L (ref 9–46)
AST: 21 U/L (ref 10–35)
Albumin: 4.4 g/dL (ref 3.6–5.1)
Alkaline phosphatase (APISO): 70 U/L (ref 40–115)
BUN: 14 mg/dL (ref 7–25)
CALCIUM: 9.7 mg/dL (ref 8.6–10.3)
CO2: 25 mmol/L (ref 20–32)
CREATININE: 0.72 mg/dL (ref 0.70–1.18)
Chloride: 103 mmol/L (ref 98–110)
GFR, Est African American: 110 mL/min/{1.73_m2} (ref >=60)
GFR, Est Non African American: 95 mL/min/{1.73_m2} (ref >=60)
GLOBULIN: 2.3 g/dL (ref 1.9–3.7)
Glucose, Bld: 82 mg/dL (ref 65–99)
Potassium: 4.1 mmol/L (ref 3.5–5.3)
SODIUM: 137 mmol/L (ref 135–146)
Total Bilirubin: 0.7 mg/dL (ref 0.2–1.2)
Total Protein: 6.7 g/dL (ref 6.1–8.1)

## 2017-10-24 LAB — LIPID PANEL
CHOLESTEROL: 142 mg/dL (ref <200)
HDL: 67 mg/dL (ref >40)
LDL Cholesterol (Calc): 60 mg/dL (calc)
NON-HDL CHOLESTEROL (CALC): 75 mg/dL (ref <130)
Total CHOL/HDL Ratio: 2.1 (calc) (ref <5.0)
Triglycerides: 70 mg/dL (ref <150)

## 2017-10-24 LAB — VITAMIN B12: Vitamin B-12: 789 pg/mL (ref 200–1100)

## 2017-10-28 ENCOUNTER — Other Ambulatory Visit: Payer: Self-pay | Admitting: Family Medicine

## 2017-10-28 DIAGNOSIS — N401 Enlarged prostate with lower urinary tract symptoms: Secondary | ICD-10-CM

## 2017-10-28 MED ORDER — FINASTERIDE 5 MG PO TABS: 5.0000 mg | ORAL_TABLET | Freq: Every day | ORAL | 3 refills | Status: DC

## 2017-10-29 ENCOUNTER — Encounter: Payer: Self-pay | Admitting: Urology

## 2017-10-29 ENCOUNTER — Ambulatory Visit (INDEPENDENT_AMBULATORY_CARE_PROVIDER_SITE_OTHER): Payer: Medicare Other | Admitting: Urology

## 2017-10-29 VITALS — BP 166/91 | HR 91 | Ht 69.0 in | Wt 246.9 lb

## 2017-10-29 DIAGNOSIS — N529 Male erectile dysfunction, unspecified: Secondary | ICD-10-CM | POA: Diagnosis not present

## 2017-10-29 DIAGNOSIS — Z87898 Personal history of other specified conditions: Secondary | ICD-10-CM

## 2017-10-29 DIAGNOSIS — N138 Other obstructive and reflux uropathy: Secondary | ICD-10-CM

## 2017-10-29 DIAGNOSIS — Z87448 Personal history of other diseases of urinary system: Secondary | ICD-10-CM | POA: Diagnosis not present

## 2017-10-29 DIAGNOSIS — N432 Other hydrocele: Secondary | ICD-10-CM | POA: Diagnosis not present

## 2017-10-29 DIAGNOSIS — N401 Enlarged prostate with lower urinary tract symptoms: Secondary | ICD-10-CM | POA: Diagnosis not present

## 2017-10-29 LAB — BLADDER SCAN AMB NON-IMAGING: SCAN RESULT: 12

## 2017-10-29 NOTE — Progress Notes (Signed)
10/29/2017 10:21 AM   Shane Bowen [birth year] [id number]  Referring provider: Alba Cory, MD [address] [place], [place] [address]  No chief complaint on file.   HPI: Patient is a 72 year old Caucasian male with erectile dysfunction, BPH with LUTS, history of elevated PSA, history of hematuria and hydrocele repair who presents today for a 6 month follow up.    Erectile dysfunction He has been having difficulty with erections for several years.   His major complaint is no partner.  His libido is preserved.   His risk factors for ED are age, BPH, testosterone deficiency, HTN, HLD and sleep apnea.  He denies any painful erections or curvatures with his erections.   He is no longer having spontaneous erections.    BPH WITH LUTS  (prostate and/or bladder) His IPSS score today is 7, which is mild lower urinary tract symptomatology.  He is mixed with his quality life due to his urinary symptoms.   His PVR is 12 mL.  His previous IPSS score was 15/3.  His PVR was 53 mL.  He has no major complaints today.  He denies any dysuria, hematuria or suprapubic pain.   He currently taking finasteride 5 mg daily and tamsulosin 0.4 mg daily.    His has had a cystoscopy on 04/23/2017 revealed coapting lateral lobes with prominent hypervascularity and a prostatic urethral length of approximately 5 cm.    He also denies any recent fevers, chills, nausea or vomiting.  He does not have a family history of PCa.  IPSS    Row Name 10/29/17 1000         International Prostate Symptom Score   How often have you had the sensation of not emptying your bladder?  Less than 1 in 5     How often have you had to urinate less than every two hours?  Less than half the time     How often have you found you stopped and started again several times when you urinated?  Less than 1 in 5 times     How often have you found it difficult to postpone urination?  Less than 1 in 5 times     How often  have you had a weak urinary stream?  Less than 1 in 5 times     How often have you had to strain to start urination?  Not at All     How many times did you typically get up at night to urinate?  1 Time     Total IPSS Score  7       Quality of Life due to urinary symptoms   If you were to spend the rest of your life with your urinary condition just the way it is now how would you feel about that?  Mixed        Score:  1-7 Mild 8-19 Moderate 20-35 Severe   History of elevated PSA Patient had an elevated PSA of 4.5 ng/mL on 10/03/2011. His PSA's have returned below 4 since that time. PSA drawn today.    History of hematuria Patient underwent workup for gross hematuria in 04/2013 with CT urogram and cystoscopy. He was found to have bilateral renal cysts, an enlarged prostate and hydrocele. He had an episode of gross hematuria and underwent a second hematuria workup with CTU and cystoscopy in 2017.  No malignancies were found.  He does not report any gross hematuria.  RUS on 04/18/2017 revealed negative for  hydronephrosis or shadowing stone. Cysts in the left kidney.  Prostatomegaly.  Cysto negative for malignancies.    History of hydrocele Patient underwent a right hydrocelectomy on 01/29/2016 for a large right hydrocele.  There were no complications during the procedure. His post operative course was as expected and uneventful.  He then underwent a second hydrocelectomy on 04/29/2016.   He states the hydrocele has returned.     PMH: Past Medical History:  Diagnosis Date  . Abnormal ECG   . Androgen deficiency   . Anemia   . Bilateral renal cysts   . BPH with elevated PSA   . Chronic insomnia   . Epididymal cyst   . Erectile dysfunction   . Gross hematuria   . Hepatitis    age 20 hepatitis A  . Hydrocele, right   . Hyperglycemia   . Hyperlipidemia   . Hypertension   . Incomplete bladder emptying   . Insomnia   . Lumbago   . Metabolic syndrome   . Obstructive sleep apnea     CPAP  . Over weight   . Polio   . Post-polio syndrome   . Prostate enlargement   . Scrotal pain   . Vitamin D deficiency     Surgical History: Past Surgical History:  Procedure Laterality Date  . ANKLE FUSION Right 05/2009  . COLONOSCOPY    . COLONOSCOPY WITH PROPOFOL N/A 04/27/2015   Procedure: COLONOSCOPY WITH PROPOFOL;  Surgeon: Midge Minium, MD;  Location: [facility];  Service: Endoscopy;  Laterality: N/A;  CPAP  . HYDROCELE EXCISION Right 01/29/2016   Procedure: HYDROCELECTOMY ADULT;  Surgeon: Vanna Scotland, MD;  Location: [facility];  Service: Urology;  Laterality: Right;  . HYDROCELE EXCISION Right 04/29/2016   Procedure: HYDROCELECTOMY ADULT;  Surgeon: Vanna Scotland, MD;  Location: [facility];  Service: Urology;  Laterality: Right;  . INGUINAL HERNIA REPAIR    . LEG SURGERY    . LUMBAR LAMINECTOMY    . Right leg surgery     from polio age 58  . TENDON GRAFT Right 2009   Hand after Lacertion Injury     Home Medications:  Allergies as of 10/29/2017      Reactions   Duloxetine Hcl    Doesn't remember reaction      Medication List        Accurate as of 10/29/17 10:21 AM. Always use your most recent med list.          amlodipine-benazepril 2.5-10 MG capsule Commonly known as:  LOTREL Take 1 capsule by mouth daily.   aspirin 81 MG tablet Take 81 mg by mouth daily.   atorvastatin 40 MG tablet Commonly known as:  LIPITOR Take 1 tablet (40 mg total) by mouth daily.   docusate sodium 100 MG capsule Commonly known as:  COLACE Take 100 mg by mouth 2 (two) times daily as needed for mild constipation.   finasteride 5 MG tablet Commonly known as:  PROSCAR Take 1 tablet (5 mg total) by mouth daily.   Melatonin 2.5 MG Caps Take 2.5 mg by mouth at bedtime as needed (sleep).   naproxen sodium 220 MG tablet Commonly known as:  ALEVE Take 220 mg by mouth 2 (two) times daily as needed (pain).   tamsulosin 0.4 MG Caps capsule Commonly known as:  FLOMAX Take 1  capsule (0.4 mg total) by mouth daily.   vitamin B-12 1000 MCG tablet Commonly known as:  CYANOCOBALAMIN Take 1,000 mcg by mouth daily.  Vitamin D 2000 units Caps Take 1 capsule (2,000 Units total) by mouth daily.       Allergies:  Allergies  Allergen Reactions  . Duloxetine Hcl     Doesn't remember reaction    Family History: Family History  Problem Relation Age of Onset  . Hyperlipidemia Mother   . Heart disease Father   . Heart disease Sister        ATF  . Prostate cancer Neg Hx   . Kidney disease Neg Hx   . Kidney cancer Neg Hx   . Bladder Cancer Neg Hx     Social History:  reports that he quit smoking about 24 years ago. His smoking use included cigarettes. He started smoking about 49 years ago. He has a 37.50 pack-year smoking history. He has never used smokeless tobacco. He reports that he drinks about 7.0 standard drinks of alcohol per week. He reports that he does not use drugs.  ROS: UROLOGY Frequent Urination?: No Hard to postpone urination?: No Burning/pain with urination?: No Get up at night to urinate?: No Leakage of urine?: No Urine stream starts and stops?: No Trouble starting stream?: No Do you have to strain to urinate?: No Blood in urine?: No Urinary tract infection?: No Sexually transmitted disease?: No Injury to kidneys or bladder?: No Painful intercourse?: No Weak stream?: No Erection problems?: No Penile pain?: No  Gastrointestinal Nausea?: No Vomiting?: No Indigestion/heartburn?: No Diarrhea?: No Constipation?: No  Constitutional Night sweats?: No Weight loss?: No Fatigue?: No  Skin Skin rash/lesions?: No Itching?: No  Eyes Blurred vision?: No Double vision?: No  Ears/Nose/Throat Sore throat?: No Sinus problems?: No  Hematologic/Lymphatic Swollen glands?: No Easy bruising?: No  Cardiovascular Leg swelling?: No Chest pain?: No  Respiratory Cough?: No Shortness of breath?: No  Endocrine Excessive thirst?:  No  Musculoskeletal Back pain?: No  Neurological Headaches?: No Dizziness?: No  Psychologic Depression?: No Anxiety?: No  Physical Exam: BP (!) 166/91 (BP Location: Left Arm, Patient Position: Sitting, Cuff Size: Normal)   Pulse 91   Ht 5\' 9"  (1.753 m)   Wt 246 lb 14.4 oz (112 kg)   BMI 36.46 kg/m   Constitutional: Well nourished. Alert and oriented, No acute distress. HEENT:  AT, moist mucus membranes. Trachea midline, no masses. Cardiovascular: No clubbing, cyanosis, or edema. Respiratory: Normal respiratory effort, no increased work of breathing. GI: Abdomen is soft, non tender, non distended, no abdominal masses. Liver and spleen not palpable.  No hernias appreciated.  Stool sample for occult testing is not indicated.   GU: No CVA tenderness.  No bladder fullness or masses.  Patient with circumcised phallus.  Urethral meatus is patent.  No penile discharge. No penile lesions or rashes. Scrotum without lesions, cysts, rashes and/or edema.  Testicles are located scrotally bilaterally. No masses are appreciated in the testicles. Left and right epididymis are normal. Rectal: Patient with  normal sphincter tone. Anus and perineum without scarring or rashes. No rectal masses are appreciated. Prostate could only palpate the apex, no nodules are appreciated. Skin: No rashes, bruises or suspicious lesions. Lymph: No cervical or inguinal adenopathy. Neurologic: Grossly intact, no focal deficits, moving all 4 extremities. Psychiatric: Normal mood and affect.  Laboratory Data: PSA History  4.3 ng/mL on 10/17/2014  3.5 ng/mL on 11/10/2014  3.9 ng/mL on 04/11/2015  4.2 ng/mL on 10/12/2015  2.4 ng/mL on 04/08/2016  1.5 ng/mL on 10/03/2016  1.6 ng/mL on 04/07/2017 I have reviewed the labs   Pertinent Imaging Results for  ARLIS, YALE (MRN [id number]) as of 10/29/2017 10:08  Ref. Range 10/29/2017 10:05  Scan Result Unknown 12      Assessment & Plan:    1. Erectile  dysfunction  - patient does not have a partner at this time  2. BPH with LUTS IPSS score is 7/3, it is improved Continue conservative management, avoiding bladder irritants and timed voiding's Most bothersome symptoms is/are urgency and urge incontinence Continue finasteride 5 mg daily and tamsulosin 0.4 mg daily Given literature for UroLift procedure RTC in 6 months for IPSS, PSA and exam   3. History of elevated PSA continue finasteride RTC in 6 months for exam and PSA - if PSA stable  4. History of hematuria Hematuria work ups - one in 2015, one in 2017 and 2019 with CTU's, RUS and cystoscopies - no malignancies were found   5. History of hydrocele:    - S/p hydrocelectomy, recurrence  -We will obtain a scrotal ultrasound to ensure that it is a recurrence of the hydrocele   Return in about 6 months (around 04/30/2018) for IPSS, PSA and exam.  These notes generated with voice recognition software. I apologize for typographical errors.  Michiel Cowboy, PA-C  [facility] [address] [place], [place] [address] [phone]

## 2017-10-30 LAB — PSA: Prostate Specific Ag, Serum: 1.4 ng/mL (ref 0.0–4.0)

## 2017-11-07 ENCOUNTER — Ambulatory Visit
Admission: RE | Admit: 2017-11-07 | Discharge: 2017-11-07 | Disposition: A | Discharge status: Home / Self Care | Payer: Medicare Other | Source: Ambulatory Visit | Attending: Urology | Admitting: Urology

## 2017-11-07 DIAGNOSIS — N432 Other hydrocele: Secondary | ICD-10-CM | POA: Insufficient documentation

## 2017-11-07 DIAGNOSIS — I861 Scrotal varices: Secondary | ICD-10-CM | POA: Diagnosis not present

## 2017-11-07 DIAGNOSIS — N433 Hydrocele, unspecified: Secondary | ICD-10-CM | POA: Diagnosis not present

## 2017-12-10 ENCOUNTER — Other Ambulatory Visit: Payer: Self-pay

## 2017-12-10 MED ORDER — TAMSULOSIN HCL 0.4 MG PO CAPS: 0.4000 mg | ORAL_CAPSULE | Freq: Every day | ORAL | 3 refills | Status: DC

## 2018-02-06 ENCOUNTER — Ambulatory Visit (INDEPENDENT_AMBULATORY_CARE_PROVIDER_SITE_OTHER): Payer: Medicare Other | Admitting: Family Medicine

## 2018-02-06 ENCOUNTER — Encounter: Payer: Self-pay | Admitting: Family Medicine

## 2018-02-06 VITALS — BP 146/78 | HR 101 | Temp 98.2°F | Resp 20 | Ht 69.0 in | Wt 249.6 lb

## 2018-02-06 DIAGNOSIS — R0989 Other specified symptoms and signs involving the circulatory and respiratory systems: Secondary | ICD-10-CM

## 2018-02-06 DIAGNOSIS — Z87891 Personal history of nicotine dependence: Secondary | ICD-10-CM | POA: Diagnosis not present

## 2018-02-06 DIAGNOSIS — Z23 Encounter for immunization: Secondary | ICD-10-CM

## 2018-02-06 DIAGNOSIS — R05 Cough: Secondary | ICD-10-CM | POA: Diagnosis not present

## 2018-02-06 DIAGNOSIS — R059 Cough, unspecified: Secondary | ICD-10-CM

## 2018-02-06 MED ORDER — AZITHROMYCIN 250 MG PO TABS: ORAL_TABLET | ORAL | 0 refills | Status: DC

## 2018-02-06 NOTE — Progress Notes (Signed)
Name: Shane InchesDaniel J Shvartsman   MRN: [id number]    DOB: 1945-08-26   Date:02/06/2018       Progress Note  Subjective  Chief Complaint  Chief Complaint  Patient presents with  . Cough    Onset-2 weeks, wheezing, SOB, clear mucus. Has tried otc Mucinex DM, Cough Drops, Honey and Tea with no relief.  . Nasal Congestion  . Diarrhea    HPI  Bronchitis: he has been sick for the past 2 weeks, initially cold symptoms with sore throat, nasal congestion and post nasal drip, tried multiple otc and natural remedies, but getting progressively worse. He states having two bowel movements daily that are loose but no mucus or blood. He has been afebrile. He is concerned about the cough that is wet, chest congestion and intermittent wheezing over the past 5 days. He used to smoke for about 20 years but quit in the 90's. He states usually gets better from URI quickly this is different   Patient Active Problem List   Diagnosis Date Noted  . Special screening for malignant neoplasms, colon   . Other hydrocele 04/17/2015  . History of elevated PSA 04/17/2015  . Change in bowel movement 03/30/2015  . Elevated PSA 10/17/2014  . History of hematuria 10/17/2014  . Erectile dysfunction of organic origin 10/17/2014  . History of poliomyelitis 09/30/2014  . Gait instability 09/30/2014  . 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 Umbilical hernia without obstruction or gangrene 09/30/2014  . Kidney cysts 07/28/2014  . BPH (benign prostatic hyperplasia) 07/28/2014  . Insomnia, persistent 07/28/2014  . Dyslipidemia 07/28/2014  . Impotence of organic origin 07/28/2014  . Essential (primary) hypertension 07/28/2014  . Blood glucose elevated 07/28/2014  . Male hypogonadism 07/28/2014  . Obesity (BMI 30-39.9) 07/28/2014  . Obstructive apnea 07/28/2014  . Spermatocele 07/28/2014  . Vitamin D deficiency 11/10/2008    Past Surgical History:  Procedure Laterality Date  . ANKLE FUSION Right 05/2009  . COLONOSCOPY    .  COLONOSCOPY WITH PROPOFOL N/A 04/27/2015   Procedure: COLONOSCOPY WITH PROPOFOL;  Surgeon: Midge Miniumarren Wohl, MD;  Location[facility];  Service: Endoscopy;  Laterality: N/A;  CPAP  . HYDROCELE EXCISION Right 01/29/2016   Procedure: HYDROCELECTOMY ADULT;  Surgeon: Vanna ScotlandAshley Brandon, MD;  Location: [facility];  Service: Urology;  Laterality: Right;  . HYDROCELE EXCISION Right 04/29/2016   Procedure: HYDROCELECTOMY ADULT;  Surgeon: Vanna ScotlandAshley Brandon, MD;  Location: [facility];  Service: Urology;  Laterality: Right;  . INGUINAL HERNIA REPAIR    . LEG SURGERY    . LUMBAR LAMINECTOMY    . Right leg surgery     from polio age 89  . TENDON GRAFT Right 2009   Hand after Lacertion Injury     Family History  Problem Relation Age of Onset  . Hyperlipidemia Mother   . Heart disease Father   . Heart disease Sister        ATF  . Prostate cancer Neg Hx   . Kidney disease Neg Hx   . Kidney cancer Neg Hx   . Bladder Cancer Neg Hx     Social History   Socioeconomic History  . Marital status: Widowed    Spouse name: Not on file  . Number of children: 2  . Years of education: Not on file  . Highest education level: Bachelor's degree (e.g., BA, AB, BS)  Occupational History  . 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  Financial resource strain: Not hard at all  . Food insecurity:    Worry: Never true    Inability: Never true  . Transportation needs:    Medical: No    Non-medical: No  Tobacco Use  . Smoking status: Former Smoker    Packs/day: 1.50    Years: 25.00    Pack years: 37.50    Types: Cigarettes    Start date: 01/22/1968    Last attempt to quit: 01/21/1993    Years since quitting: 25.0  . Smokeless tobacco: Never Used  . Tobacco comment: smoking cessation materials not required  Substance and Sexual Activity  . Alcohol use: Yes    Alcohol/week: 7.0 standard drinks    Types: 7 Glasses of wine per week    Comment:    . Drug use: No  .  Sexual activity: Not Currently  Lifestyle  . Physical activity:    Days per week: 2 days    Minutes per session: 60 min  . Stress: Not at all  Relationships  . Social connections:    Talks on phone: More than three times a week    Gets together: Three times a week    Attends religious service: More than 4 times per year    Active member of club or organization: Yes    Attends meetings of clubs or organizations: More than 4 times per year    Relationship status: Widowed  . Intimate partner violence:    Fear of current or ex partner: No    Emotionally abused: No    Physically abused: No    Forced sexual activity: No  Other Topics Concern  . Not on file  Social History Narrative  . Not on file     Current Outpatient Medications:  .  amlodipine-benazepril (LOTREL) 2.5-10 MG capsule, Take 1 capsule by mouth daily., Disp: 90 capsule, Rfl: 1 .  aspirin 81 MG tablet, Take 81 mg by mouth daily. , Disp: , Rfl:  .  atorvastatin (LIPITOR) 40 MG tablet, Take 1 tablet (40 mg total) by mouth daily., Disp: 90 tablet, Rfl: 1 .  Cholecalciferol (VITAMIN D) 2000 units CAPS, Take 1 capsule (2,000 Units total) by mouth daily., Disp: 30 capsule, Rfl: 0 .  docusate sodium (COLACE) 100 MG capsule, Take 100 mg by mouth 2 (two) times daily as needed for mild constipation., Disp: , Rfl:  .  Melatonin 2.5 MG CAPS, Take 2.5 mg by mouth at bedtime as needed (sleep)., Disp: , Rfl:  .  naproxen sodium (ANAPROX) 220 MG tablet, Take 220 mg by mouth 2 (two) times daily as needed (pain)., Disp: , Rfl:  .  tamsulosin (FLOMAX) 0.4 MG CAPS capsule, Take 1 capsule (0.4 mg total) by mouth daily., Disp: 30 capsule, Rfl: 3 .  vitamin B-12 (CYANOCOBALAMIN) 1000 MCG tablet, Take 1,000 mcg by mouth daily., Disp: , Rfl:   Allergies  Allergen Reactions  . Duloxetine Hcl     Doesn't remember reaction    I personally reviewed active problem list, medication list, allergies, family history, social history with the  patient/caregiver today.   ROS  Ten systems reviewed and is negative except as mentioned in HPI   Objective  Vitals:   02/06/18 0823  BP: (!) 146/78  Pulse: (!) 101  Resp: 20  Temp: 98.2 F (36.8 C)  TempSrc: Oral  SpO2: 96%  Weight: 249 lb 9.6 oz (113.2 kg)  Height: 5\' 9"  (1.753 m)    Body mass index is 36.86  kg/m.  Physical Exam  Constitutional: Patient appears well-developed and well-nourished. Obese  No distress.  HEENT: head atraumatic, normocephalic, pupils equal and reactive to light, ears normal TM, boggy turbinates,  neck supple, throat within normal limits  Cardiovascular: Normal rate, regular rhythm and normal heart sounds.  No murmur heard. No BLE edema. Pulmonary/Chest: Effort normal, rhonchi on upper posterior back.  No respiratory distress.  Abdominal: Soft.  There is no tenderness. Psychiatric: Patient has a normal mood and affect. behavior is normal. Judgment and thought content normal.  PHQ2/9: Depression screen [facility] 02/06/2018 10/23/2017 03/27/2017 10/03/2016 04/01/2016  Decreased Interest 0 0 0 0 0  Down, Depressed, Hopeless 0 0 0 0 0  PHQ - 2 Score 0 0 0 0 0  Altered sleeping - 1 - - -  Tired, decreased energy - 1 - - -  Change in appetite - 0 - - -  Feeling bad or failure about yourself  - 0 - - -  Trouble concentrating - 0 - - -  Moving slowly or fidgety/restless - 0 - - -  Suicidal thoughts - 0 - - -  PHQ-9 Score - 2 - - -  Difficult doing work/chores - Not difficult at all - - -    Fall Risk: Fall Risk  10/23/2017 03/27/2017 10/03/2016 04/01/2016 10/03/2015  Falls in the past year? No Yes No No No  Number falls in past yr: - 1 - - -  Injury with Fall? - No - - -  Risk for fall due to : - Impaired balance/gait - - -  Follow up - Falls prevention discussed - - -     Assessment & Plan  1. Cough  - azithromycin (ZITHROMAX) 250 MG tablet; Take 2 days and one daily after that  Dispense: 6 each; Refill: 0  2. Need for Td vaccine  He will  check coverage with insurance   3. History of tobacco use  - azithromycin (ZITHROMAX) 250 MG tablet; Take 2 days and one daily after that  Dispense: 6 each; Refill: 0  4. Rhonchi at both lung bases  - azithromycin (ZITHROMAX) 250 MG tablet; Take 2 days and one daily after that  Dispense: 6 each; Refill: 0

## 2018-02-24 ENCOUNTER — Telehealth: Payer: Self-pay | Admitting: Family Medicine

## 2018-02-24 NOTE — Telephone Encounter (Signed)
I called the patient to schedule his AWV-S with Rosanne Sack.  (The appointment type for his 10/23/17 appointment says medicare annual wellness visit, but the [phone] code wasn't billed. VDM (DD)

## 2018-02-25 DIAGNOSIS — H40013 Open angle with borderline findings, low risk, bilateral: Secondary | ICD-10-CM | POA: Diagnosis not present

## 2018-02-25 DIAGNOSIS — H524 Presbyopia: Secondary | ICD-10-CM | POA: Diagnosis not present

## 2018-02-25 DIAGNOSIS — H2513 Age-related nuclear cataract, bilateral: Secondary | ICD-10-CM | POA: Diagnosis not present

## 2018-02-27 ENCOUNTER — Ambulatory Visit: Payer: Medicare Other

## 2018-04-12 ENCOUNTER — Encounter: Payer: Self-pay | Admitting: Urology

## 2018-04-13 ENCOUNTER — Other Ambulatory Visit: Payer: Self-pay

## 2018-04-13 DIAGNOSIS — N138 Other obstructive and reflux uropathy: Secondary | ICD-10-CM

## 2018-04-13 DIAGNOSIS — N401 Enlarged prostate with lower urinary tract symptoms: Principal | ICD-10-CM

## 2018-04-13 MED ORDER — TAMSULOSIN HCL 0.4 MG PO CAPS: 0.4000 mg | ORAL_CAPSULE | Freq: Every day | ORAL | 3 refills | Status: AC

## 2018-04-20 ENCOUNTER — Telehealth: Payer: Self-pay | Admitting: Family Medicine

## 2018-04-20 ENCOUNTER — Other Ambulatory Visit: Payer: Self-pay | Admitting: Family Medicine

## 2018-04-20 DIAGNOSIS — I10 Essential (primary) hypertension: Secondary | ICD-10-CM

## 2018-04-20 DIAGNOSIS — E785 Hyperlipidemia, unspecified: Secondary | ICD-10-CM

## 2018-04-20 NOTE — Telephone Encounter (Signed)
Pt is scheduled for 04/24/2018

## 2018-04-20 NOTE — Telephone Encounter (Signed)
Patient states she has enough pills until he comes in on 04/24/2018

## 2018-04-20 NOTE — Telephone Encounter (Signed)
Copied from CRM [id number]. Topic: Quick Communication - See Telephone Encounter >> Apr 20, 2018 10:34 AM Aretta Nip wrote: CRM for notification. See Telephone encounter for: 04/20/18. PT cancelled appt for 4/3 this am and is requesting to do a telephone or Web based visit. Please contact for a resch at [phone]. All phone #'s and e mail have been verified.  Please contact pt

## 2018-04-24 ENCOUNTER — Ambulatory Visit (INDEPENDENT_AMBULATORY_CARE_PROVIDER_SITE_OTHER): Payer: Medicare Other | Admitting: Family Medicine

## 2018-04-24 ENCOUNTER — Ambulatory Visit: Payer: Medicare Other | Admitting: Family Medicine

## 2018-04-24 ENCOUNTER — Encounter: Payer: Self-pay | Admitting: Family Medicine

## 2018-04-24 VITALS — BP 135/91 | HR 83 | Temp 97.1°F | Ht 69.0 in | Wt 241.8 lb

## 2018-04-24 DIAGNOSIS — E538 Deficiency of other specified B group vitamins: Secondary | ICD-10-CM | POA: Diagnosis not present

## 2018-04-24 DIAGNOSIS — D692 Other nonthrombocytopenic purpura: Secondary | ICD-10-CM | POA: Diagnosis not present

## 2018-04-24 DIAGNOSIS — E559 Vitamin D deficiency, unspecified: Secondary | ICD-10-CM | POA: Diagnosis not present

## 2018-04-24 DIAGNOSIS — I10 Essential (primary) hypertension: Secondary | ICD-10-CM | POA: Diagnosis not present

## 2018-04-24 DIAGNOSIS — G4733 Obstructive sleep apnea (adult) (pediatric): Secondary | ICD-10-CM

## 2018-04-24 DIAGNOSIS — E785 Hyperlipidemia, unspecified: Secondary | ICD-10-CM

## 2018-04-24 DIAGNOSIS — G14 Postpolio syndrome: Secondary | ICD-10-CM | POA: Diagnosis not present

## 2018-04-24 DIAGNOSIS — R2681 Unsteadiness on feet: Secondary | ICD-10-CM

## 2018-04-24 MED ORDER — AMLODIPINE BESY-BENAZEPRIL HCL 2.5-10 MG PO CAPS: 1.0000 | ORAL_CAPSULE | Freq: Every day | ORAL | 1 refills | Status: AC

## 2018-04-24 MED ORDER — ATORVASTATIN CALCIUM 40 MG PO TABS: 40.0000 mg | ORAL_TABLET | Freq: Every day | ORAL | 1 refills | Status: AC

## 2018-04-24 NOTE — Progress Notes (Signed)
Name: Shane Bowen   MRN: [id number]    DOB: 14-Jan-1946   Date:04/24/2018       Progress Note  Subjective  Chief Complaint  Chief Complaint  Patient presents with  . Hypertension  . Hyperlipidemia  . Medication Refill    I connected with@ on 04/24/18 at  8:40 AM EDT by a video enabled telemedicine application and verified that I am speaking with the correct person using two identifiers.  I discussed the limitations of evaluation and management by telemedicine and the availability of in person appointments. The patient expressed understanding and agreed to proceed. Staff also discussed with the patient that there may be a patient responsible charge related to this service. Patient Location: home  Provider Location: [facility]   HPI  OSA: he wears APAPevery night. He is back on nasal mask without pillows. No headache in am, he wakes up feeling rested most of the time  HTN: bp was a little elevated today but usually at goal, advised to check bp more frequent , a couple of times a week to make sure not staying up.No chest pain, dizziness  or palpitation  Dyslipidemia: lipid panel looked normal, , he is on Atorvastatin, also on a healthy diet Denies any side effects of medication. Reviewed labs done Fall 2019   Obesity: he has lost 64 lbsfrom March 2017 till March 2018, he states he started to lose weight after tooth abscess, and got motivated to stay on a diet after that ( started Dec 2016), hewas feeling well. He states he hadcut out carbohydrates, no desserts, smaller portions, and replaced his snacks with high protein snacks instead of chips, and he hadbeen drinking broth late at night instead of eating, he has not been as consistent with diet but doing good most of time. He got his weight at home today, he lost 8 lbs since measured here last time   Elevated PSA history of hydrocele: seeing Urologist, last PSA was down   Gait instability: he is doing  well , still using a cane and has a brace that is helping with his gait. Gait is stable with physical activity . From post-polio syndrome and significant atrophy.   Senile purpura: on left arm, patient was given reassurance   Patient Active Problem List   Diagnosis Date Noted  . Special screening for malignant neoplasms, colon   . Other hydrocele 04/17/2015  . History of elevated PSA 04/17/2015  . Change in bowel movement 03/30/2015  . Elevated PSA 10/17/2014  . History of hematuria 10/17/2014  . Erectile dysfunction of organic origin 10/17/2014  . History of poliomyelitis 09/30/2014  . Gait instability 09/30/2014  . 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 Umbilical hernia without obstruction or gangrene 09/30/2014  . Kidney cysts 07/28/2014  . BPH (benign prostatic hyperplasia) 07/28/2014  . Insomnia, persistent 07/28/2014  . Dyslipidemia 07/28/2014  . Impotence of organic origin 07/28/2014  . Essential (primary) hypertension 07/28/2014  . Blood glucose elevated 07/28/2014  . Male hypogonadism 07/28/2014  . Obesity (BMI 30-39.9) 07/28/2014  . Obstructive apnea 07/28/2014  . Spermatocele 07/28/2014  . Vitamin D deficiency 11/10/2008    Past Surgical History:  Procedure Laterality Date  . ANKLE FUSION Right 05/2009  . COLONOSCOPY    . COLONOSCOPY WITH PROPOFOL N/A 04/27/2015   Procedure: COLONOSCOPY WITH PROPOFOL;  Surgeon: Midge Minium, MD;  [facility];  Service: Endoscopy;  Laterality: N/A;  CPAP  . HYDROCELE EXCISION Right 01/29/2016   Procedure: HYDROCELECTOMY  ADULT;  Surgeon: Vanna Scotland, MD;  Location: [facility];  Service: Urology;  Laterality: Right;  . HYDROCELE EXCISION Right 04/29/2016   Procedure: HYDROCELECTOMY ADULT;  Surgeon: Vanna Scotland, MD;  Location: [facility];  Service: Urology;  Laterality: Right;  . INGUINAL HERNIA REPAIR    . LEG SURGERY    . LUMBAR LAMINECTOMY    . Right leg surgery     from polio age 40  . TENDON GRAFT Right 2009   Hand  after Lacertion Injury     Family History  Problem Relation Age of Onset  . Hyperlipidemia Mother   . Heart disease Father   . Heart disease Sister        ATF  . Prostate cancer Neg Hx   . Kidney disease Neg Hx   . Kidney cancer Neg Hx   . Bladder Cancer Neg Hx     Social History   Socioeconomic History  . Marital status: Widowed    Spouse name: Not on file  . Number of children: 2  . Years of education: Not on file  . Highest education level: Bachelor's degree (e.g., BA, AB, BS)  Occupational History  . 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 Financial resource strain: Not hard at all  . Food insecurity:    Worry: Never true    Inability: Never true  . Transportation needs:    Medical: No    Non-medical: No  Tobacco Use  . Smoking status: Former Smoker    Packs/day: 1.50    Years: 25.00    Pack years: 37.50    Types: Cigarettes    Start date: 01/22/1968    Last attempt to quit: 01/21/1993    Years since quitting: 25.2  . Smokeless tobacco: Never Used  . Tobacco comment: smoking cessation materials not required  Substance and Sexual Activity  . Alcohol use: Yes    Alcohol/week: 7.0 standard drinks    Types: 7 Glasses of wine per week    Comment:    . Drug use: No  . Sexual activity: Not Currently  Lifestyle  . Physical activity:    Days per week: 2 days    Minutes per session: 60 min  . Stress: Not at all  Relationships  . Social connections:    Talks on phone: More than three times a week    Gets together: Three times a week    Attends religious service: More than 4 times per year    Active member of club or organization: Yes    Attends meetings of clubs or organizations: More than 4 times per year    Relationship status: Widowed  . Intimate partner violence:    Fear of current or ex partner: No    Emotionally abused: No    Physically abused: No    Forced sexual activity: No  Other Topics Concern  .  Not on file  Social History Narrative  . Not on file     Current Outpatient Medications:  .  amlodipine-benazepril (LOTREL) 2.5-10 MG capsule, Take 1 capsule by mouth daily., Disp: 90 capsule, Rfl: 1 .  aspirin 81 MG tablet, Take 81 mg by mouth daily. , Disp: , Rfl:  .  atorvastatin (LIPITOR) 40 MG tablet, Take 1 tablet (40 mg total) by mouth daily., Disp: 90 tablet, Rfl: 1 .  Cholecalciferol (VITAMIN D) 2000 units CAPS, Take 1 capsule (2,000 Units total) by  mouth daily., Disp: 30 capsule, Rfl: 0 .  docusate sodium (COLACE) 100 MG capsule, Take 100 mg by mouth 2 (two) times daily as needed for mild constipation., Disp: , Rfl:  .  finasteride (PROSCAR) 5 MG tablet, , Disp: , Rfl:  .  Melatonin 2.5 MG CAPS, Take 2.5 mg by mouth at bedtime as needed (sleep)., Disp: , Rfl:  .  naproxen sodium (ANAPROX) 220 MG tablet, Take 220 mg by mouth 2 (two) times daily as needed (pain)., Disp: , Rfl:  .  tamsulosin (FLOMAX) 0.4 MG CAPS capsule, Take 1 capsule (0.4 mg total) by mouth daily., Disp: 90 capsule, Rfl: 3 .  vitamin B-12 (CYANOCOBALAMIN) 1000 MCG tablet, Take 1,000 mcg by mouth daily., Disp: , Rfl:  .  azithromycin (ZITHROMAX) 250 MG tablet, Take 2 days and one daily after that (Patient not taking: Reported on 04/24/2018), Disp: 6 each, Rfl: 0  Allergies  Allergen Reactions  . Duloxetine Hcl     Doesn't remember reaction    I personally reviewed active problem list, medication list, allergies, family history, social history with the patient/caregiver today.   ROS  Constitutional: Negative for fever or weight change.  Respiratory: Negative for cough and shortness of breath.   Cardiovascular: Negative for chest pain or palpitations.  Gastrointestinal: Negative for abdominal pain, no bowel changes.  Musculoskeletal: positive  for gait problem but no  joint swelling.  Skin: Negative for rash.  Neurological: Negative for dizziness or headache.  No other specific complaints in a complete  review of systems (except as listed in HPI above).  Objective  Virtual encounter, vitals not obtained.  Body mass index is 35.71 kg/m.  Physical Exam  Awake, Alert and oriented and in no distress   PHQ2/9: Depression screen [facility] 2/9 04/24/2018 02/06/2018 10/23/2017 03/27/2017 10/03/2016  Decreased Interest 0 0 0 0 0  Down, Depressed, Hopeless 0 0 0 0 0  PHQ - 2 Score 0 0 0 0 0  Altered sleeping 0 - 1 - -  Tired, decreased energy 0 - 1 - -  Change in appetite 0 - 0 - -  Feeling bad or failure about yourself  0 - 0 - -  Trouble concentrating 0 - 0 - -  Moving slowly or fidgety/restless 0 - 0 - -  Suicidal thoughts 0 - 0 - -  PHQ-9 Score 0 - 2 - -  Difficult doing work/chores - - Not difficult at all - -   PHQ-2/9 Result is negative.    Fall Risk: Fall Risk  04/24/2018 10/23/2017 03/27/2017 10/03/2016 04/01/2016  Falls in the past year? 1 No Yes No No  Number falls in past yr: 0 - 1 - -  Injury with Fall? 0 - No - -  Risk for fall due to : - - Impaired balance/gait - -  Follow up - - Falls prevention discussed - -     Assessment & Plan  1. Essential (primary) hypertension  - amlodipine-benazepril (LOTREL) 2.5-10 MG capsule; Take 1 capsule by mouth daily.  Dispense: 90 capsule; Refill: 1  2. Dyslipidemia  - atorvastatin (LIPITOR) 40 MG tablet; Take 1 tablet (40 mg total) by mouth daily.  Dispense: 90 tablet; Refill: 1  3. Gait instability  Stable  4. Post-polio syndrome   5. B12 deficiency  Still supplementation otc   6. Senile purpura (HCC)  Stable  7. Obstructive apnea  Very compliant with CPAP   8. Vitamin D deficiency  Continue vitamin D otc  9. Morbid obesity (HCC)  BMI above 35 with co-morbidities : HTN, hyperlipidemia, OSA   I discussed the assessment and treatment plan with the patient. The patient was provided an opportunity to ask questions and all were answered. The patient agreed with the plan and demonstrated an understanding of the  instructions.  The patient was advised to call back or seek an in-person evaluation if the symptoms worsen or if the condition fails to improve as anticipated.  I provided 25 minutes of non-face-to-face time during this encounter.

## 2018-04-29 ENCOUNTER — Other Ambulatory Visit: Payer: Medicare Other

## 2018-05-01 ENCOUNTER — Ambulatory Visit: Payer: Medicare Other | Admitting: Urology

## 2018-07-23 ENCOUNTER — Encounter: Payer: Self-pay | Admitting: Family Medicine

## 2018-08-04 DIAGNOSIS — G4733 Obstructive sleep apnea (adult) (pediatric): Secondary | ICD-10-CM | POA: Diagnosis not present

## 2018-08-04 DIAGNOSIS — E7800 Pure hypercholesterolemia, unspecified: Secondary | ICD-10-CM | POA: Diagnosis not present

## 2018-08-04 DIAGNOSIS — Z6839 Body mass index (BMI) 39.0-39.9, adult: Secondary | ICD-10-CM | POA: Diagnosis not present

## 2018-08-04 DIAGNOSIS — N400 Enlarged prostate without lower urinary tract symptoms: Secondary | ICD-10-CM | POA: Diagnosis not present

## 2018-08-04 DIAGNOSIS — Z8612 Personal history of poliomyelitis: Secondary | ICD-10-CM | POA: Diagnosis not present

## 2018-08-04 DIAGNOSIS — Z7689 Persons encountering health services in other specified circumstances: Secondary | ICD-10-CM | POA: Diagnosis not present

## 2018-08-04 DIAGNOSIS — I10 Essential (primary) hypertension: Secondary | ICD-10-CM | POA: Diagnosis not present

## 2018-09-19 IMAGING — US US RENAL
1 series · 14 of 25 positions shown · non-contrast
Comparison: CT 11/10/2015

CLINICAL DATA: Microscopic hematuria

EXAM:
RENAL / URINARY TRACT ULTRASOUND COMPLETE

[Series 1: us renal · 14 of 42 slices shown]
[im 1/42]
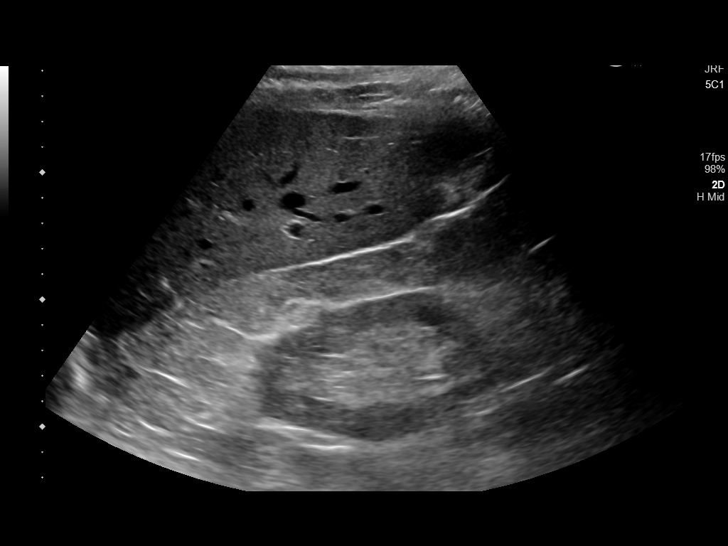
[im 4/42]
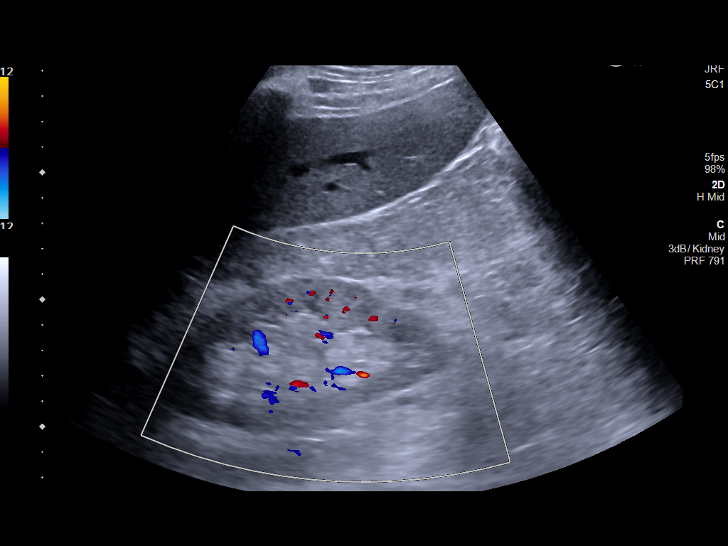
[im 7/42]
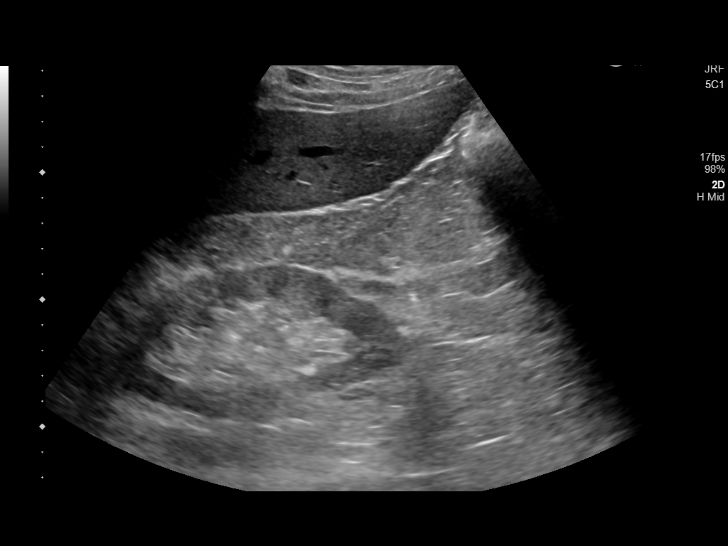
[im 11/42]
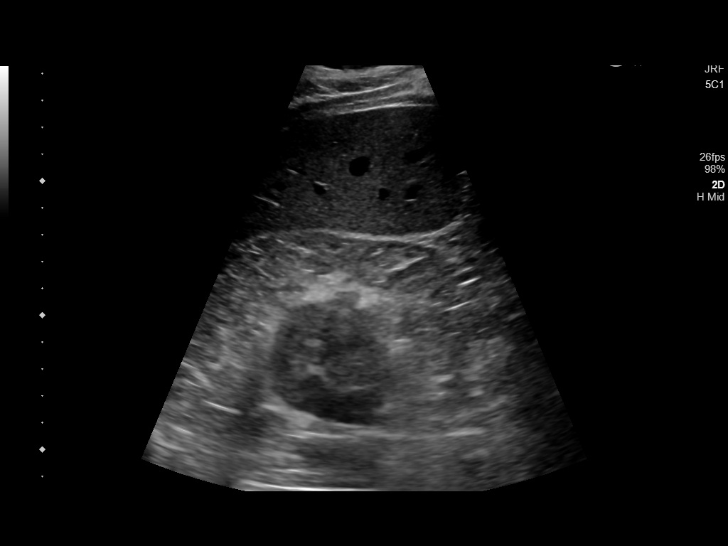
[im 14/42]
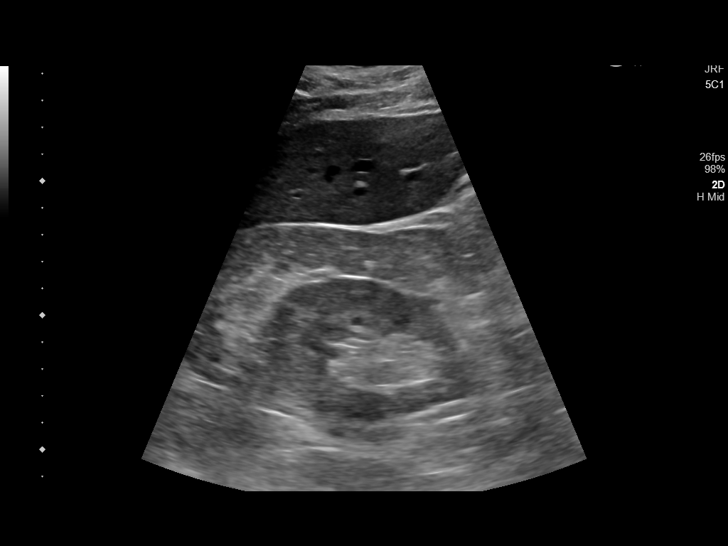
[im 16/42]
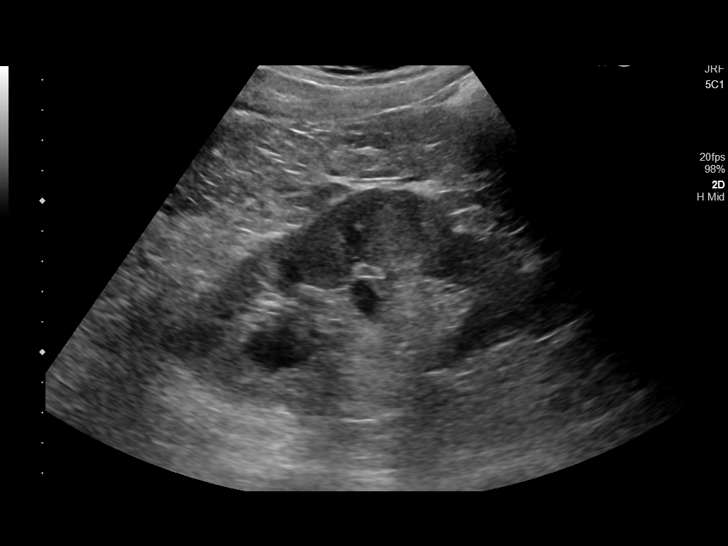
[im 19/42]
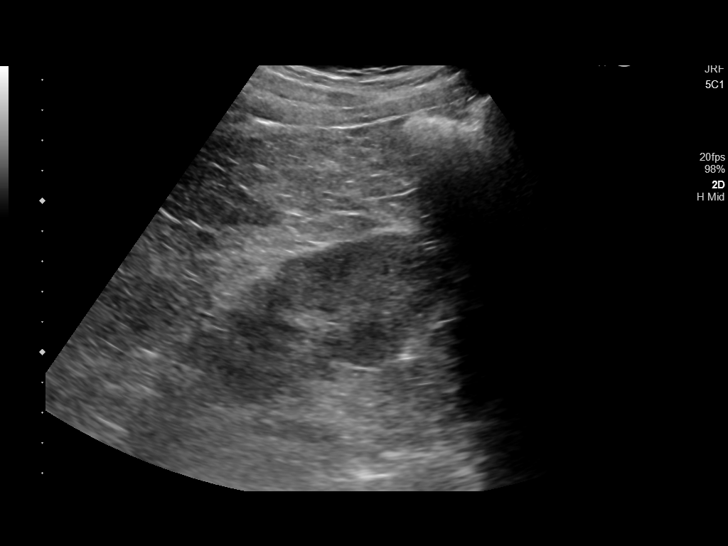
[im 23/42]
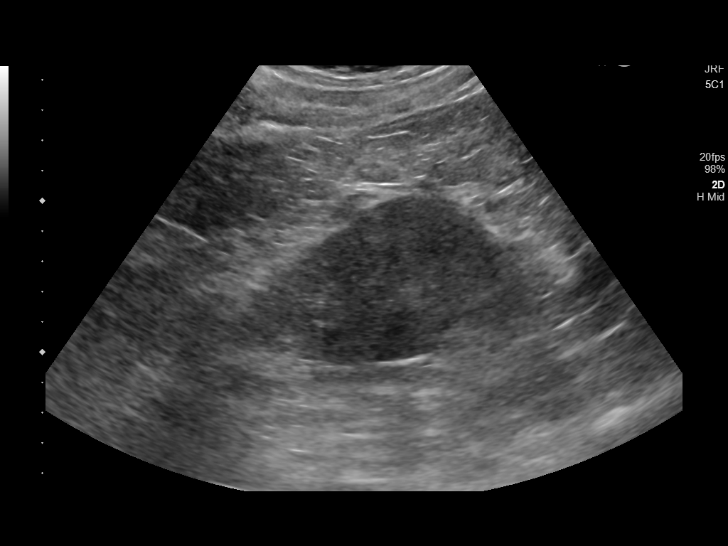
[im 26/42]
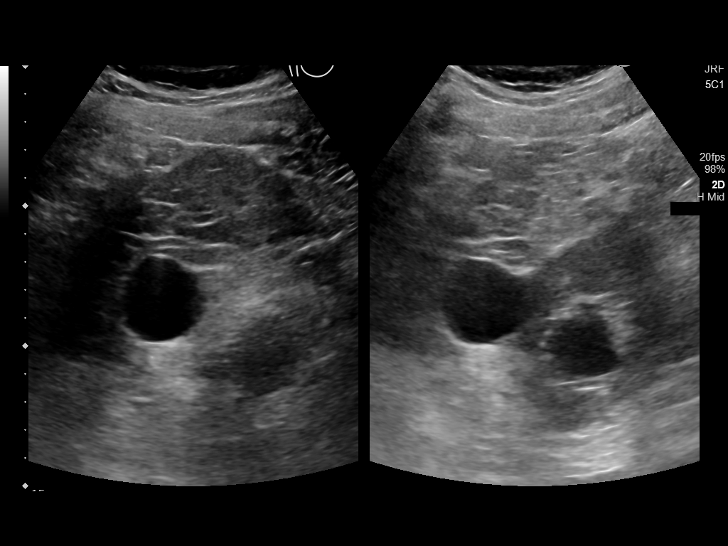
[im 28/42]
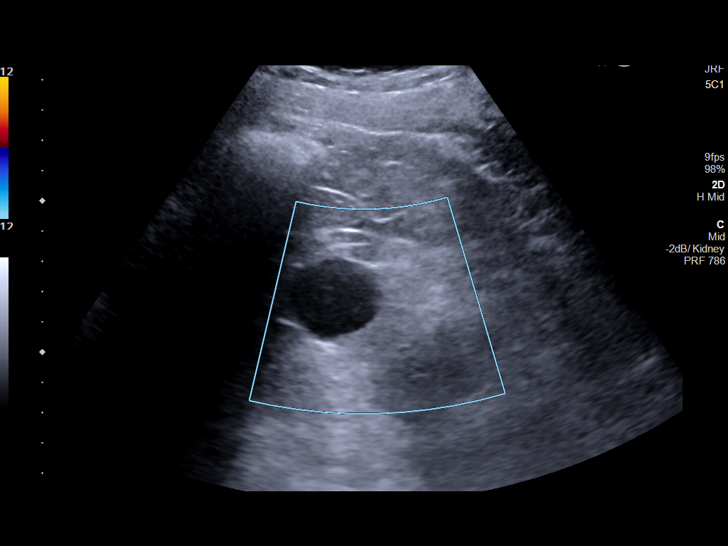
[im 31/42]
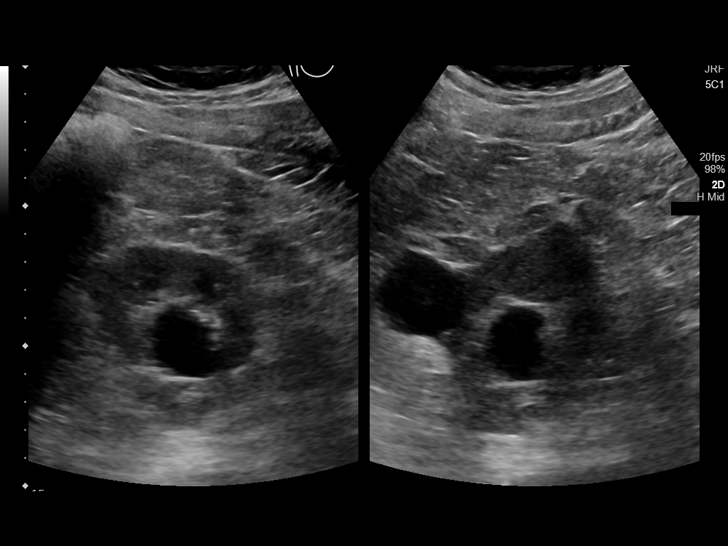
[im 35/42]
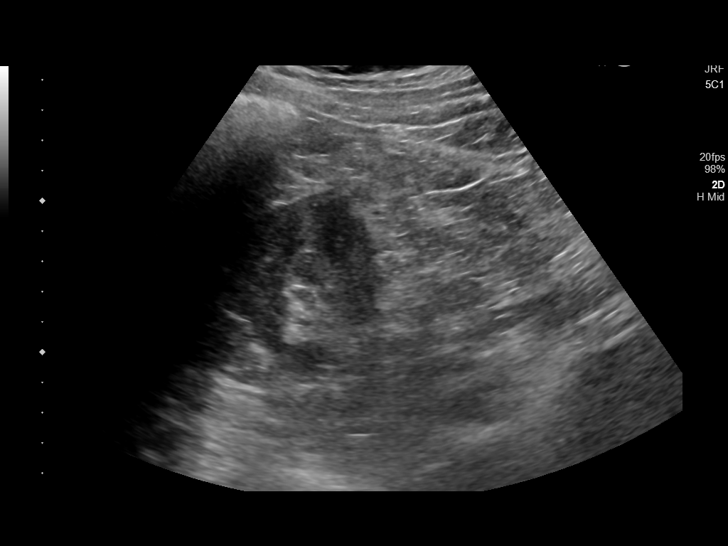
[im 38/42]
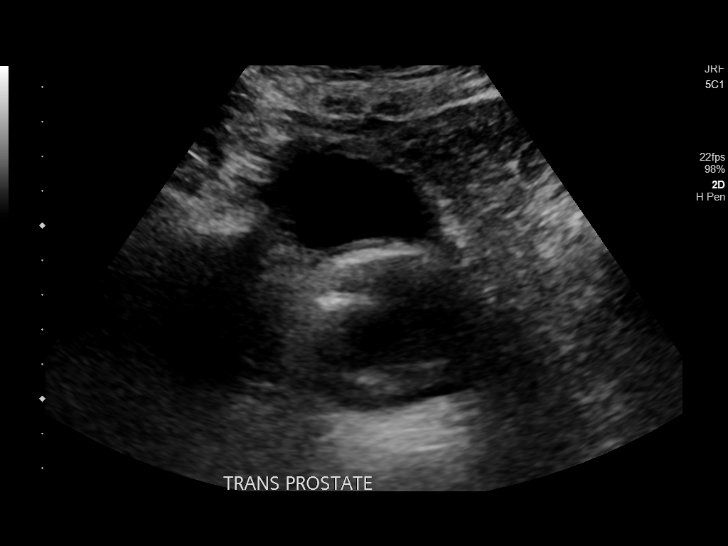
[im 42/42]
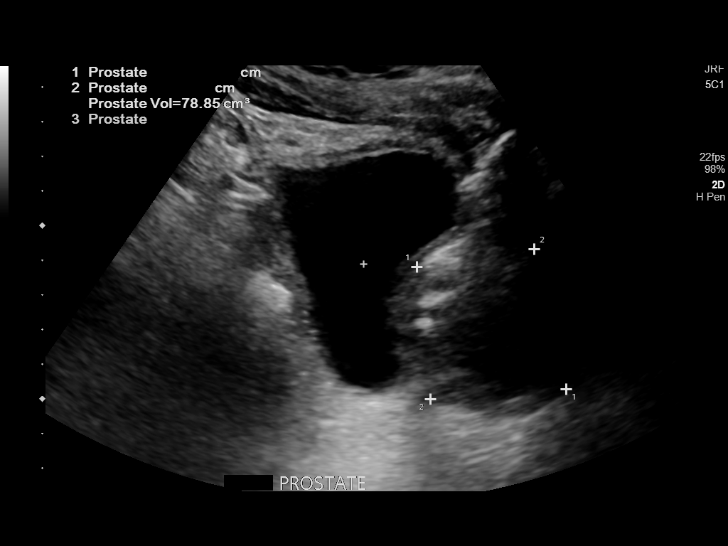

[14 of 25 positions shown; findings below may reference images not displayed]

FINDINGS: Right Kidney:

Length: 11.4 cm. Echogenicity within normal limits. No mass or
hydronephrosis visualized.

Left Kidney:

Length: 12.2 cm. Echogenicity within normal limits. No
hydronephrosis. Two cysts in the upper pole measuring 3.1 x 3.1 x 3
cm and 2.1 x 2.6 x 2.2 cm.

Bladder:

Appears normal for degree of bladder distention. Enlarged prostate
with volume of 78.9 cubic cm.
IMPRESSION: 1. Negative for hydronephrosis or shadowing stone. Cysts in the left
kidney
2. Prostatomegaly

## 2018-10-06 DIAGNOSIS — G4733 Obstructive sleep apnea (adult) (pediatric): Secondary | ICD-10-CM | POA: Diagnosis not present

## 2018-10-06 DIAGNOSIS — E7800 Pure hypercholesterolemia, unspecified: Secondary | ICD-10-CM | POA: Diagnosis not present

## 2018-10-06 DIAGNOSIS — Z23 Encounter for immunization: Secondary | ICD-10-CM | POA: Diagnosis not present

## 2018-10-06 DIAGNOSIS — Z1159 Encounter for screening for other viral diseases: Secondary | ICD-10-CM | POA: Diagnosis not present

## 2018-10-06 DIAGNOSIS — N400 Enlarged prostate without lower urinary tract symptoms: Secondary | ICD-10-CM | POA: Diagnosis not present

## 2018-10-06 DIAGNOSIS — Z0000 Encounter for general adult medical examination without abnormal findings: Secondary | ICD-10-CM | POA: Diagnosis not present

## 2018-10-06 DIAGNOSIS — Z8612 Personal history of poliomyelitis: Secondary | ICD-10-CM | POA: Diagnosis not present

## 2018-10-06 DIAGNOSIS — Z6839 Body mass index (BMI) 39.0-39.9, adult: Secondary | ICD-10-CM | POA: Diagnosis not present

## 2018-10-06 DIAGNOSIS — I10 Essential (primary) hypertension: Secondary | ICD-10-CM | POA: Diagnosis not present

## 2018-10-20 DIAGNOSIS — Z6838 Body mass index (BMI) 38.0-38.9, adult: Secondary | ICD-10-CM | POA: Diagnosis not present

## 2018-10-20 DIAGNOSIS — N400 Enlarged prostate without lower urinary tract symptoms: Secondary | ICD-10-CM | POA: Diagnosis not present

## 2018-10-20 DIAGNOSIS — N433 Hydrocele, unspecified: Secondary | ICD-10-CM | POA: Diagnosis not present

## 2018-10-30 ENCOUNTER — Other Ambulatory Visit: Payer: Self-pay | Admitting: Family Medicine

## 2018-10-30 ENCOUNTER — Other Ambulatory Visit: Payer: Self-pay | Admitting: Urology

## 2018-10-30 DIAGNOSIS — E785 Hyperlipidemia, unspecified: Secondary | ICD-10-CM

## 2018-10-30 DIAGNOSIS — I10 Essential (primary) hypertension: Secondary | ICD-10-CM

## 2018-10-30 NOTE — Telephone Encounter (Signed)
Pt said that he was getting all this taken care of at his pharm. Disregard the request.

## 2018-10-30 NOTE — Telephone Encounter (Signed)
Requested medication (s) are due for refill today: yes  Requested medication (s) are on the active medication list: yes  Last refill:  08/03/2018  Future visit scheduled: no  Notes to clinic: overdue for office visit  Review for refill   Requested Prescriptions  Pending Prescriptions Disp Refills   atorvastatin (LIPITOR) 40 MG tablet [Pharmacy Med Name: ATORVASTATIN 40 MG TABLET] 90 tablet 0    Sig: TAKE 1 TABLET BY MOUTH EVERY DAY     Cardiovascular:  Antilipid - Statins Failed - 10/30/2018  1:00 AM      Failed - Total Cholesterol in normal range and within 360 days    Cholesterol, Total  Date Value Ref Range Status  09/30/2014 130 100 - 199 mg/dL Final   Cholesterol  Date Value Ref Range Status  10/23/2017 142 <200 mg/dL Final         Failed - LDL in normal range and within 360 days    LDL Cholesterol (Calc)  Date Value Ref Range Status  10/23/2017 60 mg/dL (calc) Final    Comment:    Reference range: <100 . Desirable range <100 mg/dL for primary prevention;   <70 mg/dL for patients with CHD or diabetic patients  with > or = 2 CHD risk factors. Marland Kitchen LDL-C is now calculated using the Martin-Hopkins  calculation, which is a validated novel method providing  better accuracy than the Friedewald equation in the  estimation of LDL-C.  Cresenciano Genre et al. Annamaria Helling. 6546;503(54): 2061-2068  (http://education.[facility].com/faq/FAQ164)          Failed - HDL in normal range and within 360 days    HDL  Date Value Ref Range Status  10/23/2017 67 >40 mg/dL Final  09/30/2014 53 >39 mg/dL Final    Comment:    According to ATP-III Guidelines, HDL-C >59 mg/dL is considered a negative risk factor for CHD.          Failed - Triglycerides in normal range and within 360 days    Triglycerides  Date Value Ref Range Status  10/23/2017 70 <150 mg/dL Final         Passed - Patient is not pregnant      Passed - Valid encounter within last 12 months    Recent Outpatient Visits           6 months ago Essential (primary) hypertension   [facility] Steele Sizer, MD   8 months ago Cough   [facility] Steele Sizer, MD   1 year ago Medicare annual wellness visit, subsequent   [facility], Drue Stager, MD   1 year ago Essential (primary) hypertension   [facility] [place], Drue Stager, MD   2 years ago Medicare annual wellness visit, subsequent   [facility] [place], Drue Stager, MD              amlodipine-benazepril (LOTREL) 2.5-10 MG capsule [Pharmacy Med Name: AMLODIPINE-BENAZEPRIL 2.5-10] 90 capsule 0    Sig: TAKE 1 CAPSULE BY MOUTH EVERY DAY     Cardiovascular: CCB + ACEI Combos Failed - 10/30/2018  1:00 AM      Failed - Cr in normal range and within 180 days    Creat  Date Value Ref Range Status  10/23/2017 0.72 0.70 - 1.18 mg/dL Final    Comment:    For patients >28 years of age, the reference limit for Creatinine is approximately 13% higher for people identified as African-American. Marland Kitchen  Failed - K in normal range and within 180 days    Potassium  Date Value Ref Range Status  10/23/2017 4.1 3.5 - 5.3 mmol/L Final         Failed - Last BP in normal range    BP Readings from Last 1 Encounters:  04/24/18 (!) 135/91         Failed - Valid encounter within last 6 months    Recent Outpatient Visits          6 months ago Essential (primary) hypertension   [facility] Alba Cory, MD   8 months ago Cough   [facility] Alba Cory, MD   1 year ago Medicare annual wellness visit, subsequent   [facility] Alba Cory, MD   1 year ago Essential (primary) hypertension   [facility] Alba Cory, MD   2 years ago Medicare annual wellness visit, subsequent   [facility] Alba Cory, MD             Passed - Patient is not  pregnant

## 2018-10-30 NOTE — Telephone Encounter (Signed)
It is a medical release form on file. He is no longer a patient at [facility]

## 2018-11-30 DIAGNOSIS — Z20828 Contact with and (suspected) exposure to other viral communicable diseases: Secondary | ICD-10-CM | POA: Diagnosis not present

## 2018-12-21 ENCOUNTER — Telehealth: Payer: Self-pay | Admitting: Urology

## 2018-12-21 NOTE — Telephone Encounter (Signed)
Shane Bowen is due for his yearly visit to check his PSA and prostate exam.  Please call him and have him schedule an appointment.

## 2018-12-24 NOTE — Telephone Encounter (Signed)
I called and spoke with the pt and he states that he has moved to [facility] and is seeing a Personal assistant. He says "Hello".

## 2019-01-04 ENCOUNTER — Telehealth: Payer: Self-pay

## 2019-01-06 NOTE — Telephone Encounter (Signed)
Error

## 2019-02-06 ENCOUNTER — Other Ambulatory Visit: Payer: Self-pay | Admitting: Urology

## 2019-02-06 DIAGNOSIS — N138 Other obstructive and reflux uropathy: Secondary | ICD-10-CM

## 2019-05-04 DIAGNOSIS — I10 Essential (primary) hypertension: Secondary | ICD-10-CM | POA: Diagnosis not present

## 2019-05-04 DIAGNOSIS — E7800 Pure hypercholesterolemia, unspecified: Secondary | ICD-10-CM | POA: Diagnosis not present

## 2019-05-04 DIAGNOSIS — N400 Enlarged prostate without lower urinary tract symptoms: Secondary | ICD-10-CM | POA: Diagnosis not present

## 2019-05-04 DIAGNOSIS — G4733 Obstructive sleep apnea (adult) (pediatric): Secondary | ICD-10-CM | POA: Diagnosis not present
